# Patient Record
Sex: Male | Born: 1975 | Race: White | Hispanic: No | Marital: Single | Smoking: Never smoker
Health system: Southern US, Community
[De-identification: ages and names within clinical notes are randomized; demographics above are authoritative.]

## PROBLEM LIST (undated history)

## (undated) DIAGNOSIS — I5042 Chronic combined systolic (congestive) and diastolic (congestive) heart failure: Secondary | ICD-10-CM

## (undated) DIAGNOSIS — I10 Essential (primary) hypertension: Secondary | ICD-10-CM

## (undated) DIAGNOSIS — I447 Left bundle-branch block, unspecified: Secondary | ICD-10-CM

## (undated) DIAGNOSIS — I428 Other cardiomyopathies: Secondary | ICD-10-CM

## (undated) DIAGNOSIS — I3139 Other pericardial effusion (noninflammatory): Secondary | ICD-10-CM

## (undated) DIAGNOSIS — I313 Pericardial effusion (noninflammatory): Secondary | ICD-10-CM

## (undated) HISTORY — DX: Other pericardial effusion (noninflammatory): I31.39

## (undated) HISTORY — PX: NEUROBLASTOMA EXCISION: SHX2088

## (undated) HISTORY — DX: Chronic combined systolic (congestive) and diastolic (congestive) heart failure: I50.42

## (undated) HISTORY — DX: Pericardial effusion (noninflammatory): I31.3

## (undated) HISTORY — DX: Left bundle-branch block, unspecified: I44.7

## (undated) HISTORY — DX: Other cardiomyopathies: I42.8

---

## 2017-10-02 ENCOUNTER — Ambulatory Visit (HOSPITAL_COMMUNITY)
Admission: EM | Admit: 2017-10-02 | Discharge: 2017-10-02 | Disposition: A | Discharge status: Home / Self Care | Payer: Medicaid Other | Attending: Family Medicine | Admitting: Family Medicine

## 2017-10-02 ENCOUNTER — Other Ambulatory Visit: Payer: Self-pay

## 2017-10-02 ENCOUNTER — Encounter (HOSPITAL_COMMUNITY): Payer: Self-pay

## 2017-10-02 DIAGNOSIS — R079 Chest pain, unspecified: Secondary | ICD-10-CM | POA: Diagnosis not present

## 2017-10-02 HISTORY — DX: Essential (primary) hypertension: I10

## 2017-10-02 NOTE — ED Notes (Signed)
RR = 26.  Pt denies any SOB or any pain/indigestion at this time.

## 2017-10-02 NOTE — ED Provider Notes (Signed)
[facility]    CSN: [id number] Arrival date & time: 10/02/17  1557     History   Chief Complaint Chief Complaint  Patient presents with  . Gastroesophageal Reflux    HPI John Olsen is a 42 y.o. male.   42 yo male here for shortness of breath and midsternal chest pain x 2 weeks on and off. He thought it was indigestion but the episodes are occurring more frequently. Sometimes walking around causes the episodes. No family history of cardiac disease. He does not take blood pressure medication currently      Past Medical History:  Diagnosis Date  . Hypertension     There are no active problems to display for this patient.   History reviewed. No pertinent surgical history.     Home Medications    Prior to Admission medications   Not on File    Family History Family History  Problem Relation Age of Onset  . Cancer Mother   . Healthy Father     Social History Social History   Tobacco Use  . Smoking status: Never Smoker  . Smokeless tobacco: Never Used  Substance Use Topics  . Alcohol use: Never    Frequency: Never  . Drug use: Never     Allergies   Penicillins   Review of Systems Review of Systems  Constitutional: Negative for activity change and appetite change.  HENT: Negative for congestion and ear discharge.   Eyes: Negative for discharge and itching.  Respiratory: Negative for apnea and cough.   Cardiovascular: Positive for chest pain.  Gastrointestinal: Positive for abdominal pain.  Endocrine: Negative for cold intolerance and heat intolerance.  Genitourinary: Negative for difficulty urinating and dysuria.  Musculoskeletal: Negative for arthralgias and back pain.  Hematological: Negative for adenopathy. Does not bruise/bleed easily.     Physical Exam Triage Vital Signs ED Triage Vitals  Enc Vitals Group     BP 10/02/17 1626 (!) 172/114     Pulse Rate 10/02/17 1626 (!) 101     Resp 10/02/17 1626 18     Temp 10/02/17  1626 98 F (36.7 C)     Temp src --      SpO2 10/02/17 1626 100 %     Weight 10/02/17 1627 200 lb (90.7 kg)     Height --      Head Circumference --      Peak Flow --      Pain Score 10/02/17 1627 0     Pain Loc --      Pain Edu? --      Excl. in GC? --    No data found.  Updated Vital Signs BP (!) 172/114   Pulse (!) 101   Temp 98 F (36.7 C)   Resp 18   Wt 200 lb (90.7 kg)   SpO2 100%   Visual Acuity Right Eye Distance:   Left Eye Distance:   Bilateral Distance:    Right Eye Near:   Left Eye Near:    Bilateral Near:     Physical Exam CONSTITUTIONAL: Well-developed, well-nourished male in no acute distress.  EYES: EOM intact, conjunctivae normal, no scleral icterus HEAD: Normocephalic, atraumatic ENT: External right and left ear normal, oropharynx is clear and moist. CARDIOVASCULAR: No cyanosis or edema. 2+ distal pulses.  RESPIRATORY:Effort and breath sounds normal, no problems with respiration noted. GASTROINTESTINAL:Soft, normal bowel sounds, no distention noted.  No tenderness, rebound or guarding.  MUSCULOSKELETAL: Normal range of motion. No tenderness.  SKIN: Skin is warm and dry. No rash noted. Not diaphoretic. No erythema. No pallor. NEUROLGIC: Alert and oriented to person, place, and time. Normal reflexes, muscle tone, coordination. No cranial nerve deficit noted. PSYCHIATRIC: Normal mood and affect. Normal behavior. Normal judgment and thought content. HEM/LYMPH/IMMUNOLOGIC: Neck supple, no masses.  Normal thyroid.    UC Treatments / Results  Labs (all labs ordered are listed, but only abnormal results are displayed) Labs Reviewed - No data to display  EKG None Radiology No results found.  Procedures Procedures (including critical care time)  Medications Ordered in UC Medications - No data to display   Initial Impression / Assessment and Plan / UC Course  I have reviewed the triage vital signs and the nursing notes.  Pertinent labs &  imaging results that were available during my care of the patient were reviewed by me and considered in my medical decision making (see chart for details).     1. Chest pain- EKG shows left bundle branch block. With symptoms of SOB and midsternal chest pain, I advised patient to go to ED for further evaluation and r/o ACS. He agrees to this.   Final Clinical Impressions(s) / UC Diagnoses   Final diagnoses:  None    ED Discharge Orders    None       Controlled Substance Prescriptions  Controlled Substance Registry consulted? Not Applicable   Rolm Bookbinder, DO 10/02/17 1703

## 2017-10-02 NOTE — ED Notes (Signed)
EKG shown to Dr. Rachelle Hora.

## 2017-10-02 NOTE — ED Triage Notes (Signed)
Pt presents with multiple complaints. States over the last two weeks he has had on and off shortness of breath and weakness, trouble digesting food and indigestion.  Pt denies any symptoms at this time. Reports getting over a cold.

## 2017-10-11 ENCOUNTER — Encounter (HOSPITAL_COMMUNITY): Payer: Self-pay | Admitting: *Deleted

## 2017-10-11 ENCOUNTER — Emergency Department (HOSPITAL_COMMUNITY): Payer: Medicaid Other

## 2017-10-11 ENCOUNTER — Other Ambulatory Visit: Payer: Self-pay

## 2017-10-11 ENCOUNTER — Inpatient Hospital Stay (HOSPITAL_COMMUNITY): Payer: Medicaid Other

## 2017-10-11 ENCOUNTER — Inpatient Hospital Stay (HOSPITAL_COMMUNITY)
Admission: EM | Admit: 2017-10-11 | Discharge: 2017-10-14 | DRG: 287 | Disposition: A | Discharge status: Home / Self Care | Payer: Medicaid Other | Attending: Internal Medicine | Admitting: Internal Medicine

## 2017-10-11 DIAGNOSIS — E669 Obesity, unspecified: Secondary | ICD-10-CM | POA: Diagnosis present

## 2017-10-11 DIAGNOSIS — Z800 Family history of malignant neoplasm of digestive organs: Secondary | ICD-10-CM | POA: Diagnosis not present

## 2017-10-11 DIAGNOSIS — I509 Heart failure, unspecified: Secondary | ICD-10-CM | POA: Diagnosis not present

## 2017-10-11 DIAGNOSIS — I160 Hypertensive urgency: Secondary | ICD-10-CM | POA: Diagnosis not present

## 2017-10-11 DIAGNOSIS — Z880 Allergy status to penicillin: Secondary | ICD-10-CM | POA: Diagnosis not present

## 2017-10-11 DIAGNOSIS — Z6836 Body mass index (BMI) 36.0-36.9, adult: Secondary | ICD-10-CM | POA: Diagnosis not present

## 2017-10-11 DIAGNOSIS — E119 Type 2 diabetes mellitus without complications: Secondary | ICD-10-CM

## 2017-10-11 DIAGNOSIS — I340 Nonrheumatic mitral (valve) insufficiency: Secondary | ICD-10-CM | POA: Diagnosis not present

## 2017-10-11 DIAGNOSIS — Z79899 Other long term (current) drug therapy: Secondary | ICD-10-CM

## 2017-10-11 DIAGNOSIS — I420 Dilated cardiomyopathy: Secondary | ICD-10-CM

## 2017-10-11 DIAGNOSIS — I161 Hypertensive emergency: Secondary | ICD-10-CM | POA: Diagnosis present

## 2017-10-11 DIAGNOSIS — I10 Essential (primary) hypertension: Secondary | ICD-10-CM

## 2017-10-11 DIAGNOSIS — Z85858 Personal history of malignant neoplasm of other endocrine glands: Secondary | ICD-10-CM

## 2017-10-11 DIAGNOSIS — E876 Hypokalemia: Secondary | ICD-10-CM | POA: Diagnosis present

## 2017-10-11 DIAGNOSIS — Z8249 Family history of ischemic heart disease and other diseases of the circulatory system: Secondary | ICD-10-CM

## 2017-10-11 DIAGNOSIS — I5043 Acute on chronic combined systolic (congestive) and diastolic (congestive) heart failure: Secondary | ICD-10-CM | POA: Diagnosis present

## 2017-10-11 DIAGNOSIS — I5020 Unspecified systolic (congestive) heart failure: Secondary | ICD-10-CM

## 2017-10-11 DIAGNOSIS — I313 Pericardial effusion (noninflammatory): Secondary | ICD-10-CM | POA: Diagnosis present

## 2017-10-11 DIAGNOSIS — I5041 Acute combined systolic (congestive) and diastolic (congestive) heart failure: Secondary | ICD-10-CM | POA: Diagnosis not present

## 2017-10-11 DIAGNOSIS — I110 Hypertensive heart disease with heart failure: Principal | ICD-10-CM | POA: Diagnosis present

## 2017-10-11 DIAGNOSIS — I447 Left bundle-branch block, unspecified: Secondary | ICD-10-CM | POA: Diagnosis present

## 2017-10-11 LAB — CBC
HEMATOCRIT: 43.4 % (ref 39.0–52.0)
HEMOGLOBIN: 14.5 g/dL (ref 13.0–17.0)
MCH: 31 pg (ref 26.0–34.0)
MCHC: 33.4 g/dL (ref 30.0–36.0)
MCV: 92.9 fL (ref 78.0–100.0)
Platelets: 257 10*3/uL (ref 150–400)
RBC: 4.67 MIL/uL (ref 4.22–5.81)
RDW: 14.1 % (ref 11.5–15.5)
WBC: 8.8 10*3/uL (ref 4.0–10.5)

## 2017-10-11 LAB — HEMOGLOBIN A1C
Hgb A1c MFr Bld: 5.6 % (ref 4.8–5.6)
Mean Plasma Glucose: 114.02 mg/dL

## 2017-10-11 LAB — ECHOCARDIOGRAM COMPLETE
Height: 66 in
Weight: 3660.8 oz

## 2017-10-11 LAB — COMPREHENSIVE METABOLIC PANEL
ALK PHOS: 68 U/L (ref 38–126)
ALT: 43 U/L (ref 17–63)
AST: 31 U/L (ref 15–41)
Albumin: 3.3 g/dL — ABNORMAL LOW (ref 3.5–5.0)
Anion gap: 11 (ref 5–15)
BILIRUBIN TOTAL: 1.6 mg/dL — AB (ref 0.3–1.2)
BUN: 25 mg/dL — ABNORMAL HIGH (ref 6–20)
CALCIUM: 8.3 mg/dL — AB (ref 8.9–10.3)
CO2: 22 mmol/L (ref 22–32)
Chloride: 106 mmol/L (ref 101–111)
Creatinine, Ser: 1.17 mg/dL (ref 0.61–1.24)
GFR calc non Af Amer: >60 mL/min (ref >60)
Glucose, Bld: 114 mg/dL — ABNORMAL HIGH (ref 65–99)
Potassium: 3.7 mmol/L (ref 3.5–5.1)
SODIUM: 139 mmol/L (ref 135–145)
TOTAL PROTEIN: 5.9 g/dL — AB (ref 6.5–8.1)

## 2017-10-11 LAB — BRAIN NATRIURETIC PEPTIDE: B NATRIURETIC PEPTIDE 5: 1070 pg/mL — AB (ref 0.0–100.0)

## 2017-10-11 LAB — LIPID PANEL
CHOL/HDL RATIO: 4.2 ratio
Cholesterol: 121 mg/dL (ref 0–200)
HDL: 29 mg/dL — AB (ref >40)
LDL CALC: 78 mg/dL (ref 0–99)
TRIGLYCERIDES: 72 mg/dL (ref <150)
VLDL: 14 mg/dL (ref 0–40)

## 2017-10-11 LAB — MRSA PCR SCREENING: MRSA by PCR: NEGATIVE

## 2017-10-11 LAB — I-STAT TROPONIN, ED: TROPONIN I, POC: 0.03 ng/mL (ref 0.00–0.08)

## 2017-10-11 LAB — TROPONIN I

## 2017-10-11 LAB — TSH: TSH: 3.37 u[IU]/mL (ref 0.350–4.500)

## 2017-10-11 MED ORDER — NITROGLYCERIN 2 % TD OINT
1.0000 [in_us] | TOPICAL_OINTMENT | Freq: Once | TRANSDERMAL | Status: AC
  Administered 2017-10-11: 1 [in_us] via TOPICAL
  Filled 2017-10-11: qty 1

## 2017-10-11 MED ORDER — METOPROLOL TARTRATE 25 MG PO TABS: 25.0000 mg | ORAL_TABLET | Freq: Two times a day (BID) | ORAL | Status: DC

## 2017-10-11 MED ORDER — NITROGLYCERIN IN D5W 200-5 MCG/ML-% IV SOLN
0.0000 ug/min | INTRAVENOUS | Status: DC
  Administered 2017-10-11: 5 ug/min via INTRAVENOUS
  Filled 2017-10-11: qty 250

## 2017-10-11 MED ORDER — HYDRALAZINE HCL 20 MG/ML IJ SOLN
10.0000 mg | Freq: Four times a day (QID) | INTRAMUSCULAR | Status: DC | PRN
  Administered 2017-10-11: 10 mg via INTRAVENOUS
  Filled 2017-10-11: qty 1

## 2017-10-11 MED ORDER — FUROSEMIDE 10 MG/ML IJ SOLN
40.0000 mg | Freq: Two times a day (BID) | INTRAMUSCULAR | Status: DC
  Administered 2017-10-11 – 2017-10-13 (×4): 40 mg via INTRAVENOUS
  Filled 2017-10-11 (×5): qty 4

## 2017-10-11 MED ORDER — FUROSEMIDE 10 MG/ML IJ SOLN
60.0000 mg | Freq: Once | INTRAMUSCULAR | Status: AC
Start: 2017-10-11 — End: 2017-10-11
  Administered 2017-10-11: 60 mg via INTRAVENOUS
  Filled 2017-10-11: qty 8

## 2017-10-11 MED ORDER — SODIUM CHLORIDE 0.9 % IJ SOLN
INTRAMUSCULAR | Status: AC
  Filled 2017-10-11: qty 50

## 2017-10-11 MED ORDER — IOPAMIDOL (ISOVUE-370) INJECTION 76%
100.0000 mL | Freq: Once | INTRAVENOUS | Status: AC | PRN
  Administered 2017-10-11: 100 mL via INTRAVENOUS

## 2017-10-11 MED ORDER — METOPROLOL TARTRATE 25 MG PO TABS
12.5000 mg | ORAL_TABLET | Freq: Two times a day (BID) | ORAL | Status: DC
  Administered 2017-10-11: 12.5 mg via ORAL
  Filled 2017-10-11: qty 1

## 2017-10-11 MED ORDER — METOPROLOL TARTRATE 25 MG PO TABS
50.0000 mg | ORAL_TABLET | Freq: Two times a day (BID) | ORAL | Status: DC
  Administered 2017-10-11 – 2017-10-12 (×2): 50 mg via ORAL
  Filled 2017-10-11 (×2): qty 2

## 2017-10-11 MED ORDER — AMLODIPINE BESYLATE 10 MG PO TABS
10.0000 mg | ORAL_TABLET | Freq: Every day | ORAL | Status: DC
  Administered 2017-10-11: 10 mg via ORAL
  Filled 2017-10-11: qty 1

## 2017-10-11 MED ORDER — IOPAMIDOL (ISOVUE-370) INJECTION 76%
INTRAVENOUS | Status: AC
  Filled 2017-10-11: qty 100

## 2017-10-11 NOTE — H&P (Signed)
History and Physical  Abeer Amorin DGU:[id number] DOB: 07-18-1975 DOA: 10/11/2017  Referring physician: Dr Freida Busman PCP: Patient, No Pcp Per  Outpatient Specialists: None Patient coming from: Home  Chief Complaint: Worsening shortness of breath  HPI: John Olsen is a 42 y.o. male with medical history significant for hypertension, obesity who presented to the [facility] with complaints of gradually worsening shortness of breath with onset 3-4 weeks ago.  Symptoms are worsened by exertion and associated with dyspnea, orthopnea, bilateral lower extremity edema, and intermittent nonproductive cough.  Denies any chest pain or palpitations. Not taking any medications and not following up with PCP.  Denies prior history of heart disease.  ED Course: On presentation to the ED, patient was noted to have hypertensive emergency with uncontrolled blood pressure, elevated creatinine, and cardiomegaly on chest x-ray.  Personally reviewed chest x-ray. It revealed increase in pulmonary vascularity and cardiomegaly, boot shaped heart.  BNP greater than 1000, troponins negative x1, no ischemic changes on EKG.  Review of Systems: Review of systems as noted in the HPI.  All other systems reviewed and are negative.   Past Medical History:  Diagnosis Date  . Hypertension    History reviewed. No pertinent surgical history.  Social History:  reports that he has never smoked. He has never used smokeless tobacco. He reports that he does not drink alcohol or use drugs.   Allergies  Allergen Reactions  . Penicillins     Family History  Problem Relation Age of Onset  . Cancer Mother   . Healthy Father     Father with heart disease in his 52s. Mother deceased with liver and pancreatic cancer. Patient has no siblings.  He is only child.  Prior to Admission medications   Not on File    Physical Exam: BP (!) 157/113   Pulse 98   Temp 97.6 F (36.4 C) (Oral)   Resp 20   Ht 5\' 6"  (1.676 m)   Wt 103.8 kg  (228 lb 12.8 oz)   SpO2 98%   BMI 36.93 kg/m   General: 42 year old Caucasian male obese sitting up in the bed in no acute distress.  Alert and oriented x3. Eyes: Anicteric sclera. ENT: Mucous membrane moist with no erythema or exudates. Neck: No JVD or thyromegaly. Cardiovascular: Regular rate and rhythm with no rubs or gallops. Respiratory: Mild crackles at bases with no wheezes. Abdomen: Soft nontender nondistended normal bowel sounds x4 quadrant. Skin: No rashes or ulcerative lesions. Musculoskeletal: Moves all 4 extremities.  2+ edema in lower extremities bilaterally. Psychiatric: Mood is appropriate for condition and setting Neurologic: Alert and oriented x3.  No focal motor deficits.          Labs on Admission:  Basic Metabolic Panel: Recent Labs  Lab 10/11/17 0226  NA 139  K 3.7  CL 106  CO2 22  GLUCOSE 114*  BUN 25*  CREATININE 1.17  CALCIUM 8.3*   Liver Function Tests: Recent Labs  Lab 10/11/17 0226  AST 31  ALT 43  ALKPHOS 68  BILITOT 1.6*  PROT 5.9*  ALBUMIN 3.3*   No results for input(s): LIPASE, AMYLASE in the last 168 hours. No results for input(s): AMMONIA in the last 168 hours. CBC: Recent Labs  Lab 10/11/17 0226  WBC 8.8  HGB 14.5  HCT 43.4  MCV 92.9  PLT 257   Cardiac Enzymes: Recent Labs  Lab 10/11/17 0226  TROPONINI <0.03    BNP (last 3 results) Recent Labs    10/11/17 0226  BNP 1,070.0*    ProBNP (last 3 results) No results for input(s): PROBNP in the last 8760 hours.  CBG: No results for input(s): GLUCAP in the last 168 hours.  Radiological Exams on Admission: Dg Chest 2 View  Result Date: 10/11/2017 CLINICAL DATA:  Acute onset of shortness of breath and difficulty sleeping. EXAM: CHEST - 2 VIEW COMPARISON:  None. FINDINGS: The lungs are well-aerated. Vascular congestion is noted. Increased interstitial markings raise concern for mild interstitial edema. There is no evidence of pleural effusion or pneumothorax. The  heart is enlarged.  No acute osseous abnormalities are seen. IMPRESSION: Vascular congestion and cardiomegaly. Increased interstitial markings raise concern for mild interstitial edema. Electronically Signed   By: Roanna Raider M.D.   On: 10/11/2017 02:58    EKG: Independently reviewed.  Personally reviewed EKG revealed sinus tachycardia with left bundle branch block.  Assessment/Plan Present on Admission: **None**  Active Problems:   New onset of congestive heart failure (HCC)  New onset of congestive heart failure, unspecified due to incomplete workup Chest x-ray personally reviewed revealed cardiomegaly, balloon shaped heart, with increase in pulmonary vascularity Ordered CT chest to rule out pericardial effusion 2D echo ordered Cardiology consult  Hypertensive emergency Prior hx of HTN Elevated creatinine, cardiomegaly Not on medications prior to presentation Start beta-blocker Cardiology consult Continue to monitor vital signs  Dyspnea, most likely multifactorial secondary to heart failure versus others Rule out PE CTA PE ordered.  Awaiting results. O2 supplementation to maintain O2 saturation greater than 92% Continue close monitoring  Sinus tachycardia Start beta blocker Obtain CTA PE  Elevated creatinine No previous records for baseline Creatinine 1.17 Control hypertension Avoid nephrotoxic agent/hypotension/dehydration Repeat BMP in the morning  Obesity BMI 36 Recommend weight loss outpatient     DVT prophylaxis: Lovenox subcu daily  Code Status: Full code  Family Communication: None at bedside  Disposition Plan: Admit to telemetry  Consults called: Cardiology  Admission status: Inpatient    Darlin Drop MD [facility] Pager [phone]  If 7PM-7AM, please contact night-coverage [email or web address] Password [email or web address]  10/11/2017, 11:00 AM

## 2017-10-11 NOTE — ED Notes (Signed)
LS clear throughout with auscultation.

## 2017-10-11 NOTE — ED Notes (Signed)
ED TO INPATIENT HANDOFF REPORT  Name/Age/Gender John Olsen 42 y.o. male  Code Status    Code Status Orders  (From admission, onward)        Start     Ordered   10/11/17 1022  Full code  Continuous     10/11/17 1021    Code Status History    This patient has a current code status but no historical code status.      Home/SNF/Other Home  Chief Complaint CHEST PAIN; SOB  Level of Care/Admitting Diagnosis ED Disposition    ED Disposition Condition Comment   Admit  Hospital Area: [place] [[id number]]  Level of Care: Telemetry [5]  Admit to tele based on following criteria: Acute CHF  Diagnosis: New onset of congestive heart failure [facility]) [[id number]]  Admitting Physician: Kayleen Memos [[id number]]  Attending Physician: Kayleen Memos [[id number]]  Estimated length of stay: past midnight tomorrow  Certification:: I certify this patient will need inpatient services for at least 2 midnights  PT Class (Do Not Modify): Inpatient [101]  PT Acc Code (Do Not Modify): Private [1]       Medical History Past Medical History:  Diagnosis Date  . Hypertension     Allergies Allergies  Allergen Reactions  . Penicillins     IV Location/Drains/Wounds Patient Lines/Drains/Airways Status   Active Line/Drains/Airways    Name:   Placement date:   Placement time:   Site:   Days:   Peripheral IV 10/11/17 Right Antecubital   10/11/17    0754    Antecubital   less than 1          Labs/Imaging Results for orders placed or performed during the hospital encounter of 10/11/17 (from the past 48 hour(s))  CBC     Status: None   Collection Time: 10/11/17  2:26 AM  Result Value Ref Range   WBC 8.8 4.0 - 10.5 K/uL   RBC 4.67 4.22 - 5.81 MIL/uL   Hemoglobin 14.5 13.0 - 17.0 g/dL   HCT 43.4 39.0 - 52.0 %   MCV 92.9 78.0 - 100.0 fL   MCH 31.0 26.0 - 34.0 pg   MCHC 33.4 30.0 - 36.0 g/dL   RDW 14.1 11.5 - 15.5 %   Platelets 257 150 - 400 K/uL    Comment: Performed  at [facility], [place] [address], [place], [place] [address]  Comprehensive metabolic panel     Status: Abnormal   Collection Time: 10/11/17  2:26 AM  Result Value Ref Range   Sodium 139 135 - 145 mmol/L   Potassium 3.7 3.5 - 5.1 mmol/L   Chloride 106 101 - 111 mmol/L   CO2 22 22 - 32 mmol/L   Glucose, Bld 114 (H) 65 - 99 mg/dL   BUN 25 (H) 6 - 20 mg/dL   Creatinine, Ser 1.17 0.61 - 1.24 mg/dL   Calcium 8.3 (L) 8.9 - 10.3 mg/dL   Total Protein 5.9 (L) 6.5 - 8.1 g/dL   Albumin 3.3 (L) 3.5 - 5.0 g/dL   AST 31 15 - 41 U/L   ALT 43 17 - 63 U/L   Alkaline Phosphatase 68 38 - 126 U/L   Total Bilirubin 1.6 (H) 0.3 - 1.2 mg/dL   GFR calc non Af Amer >60 >60 mL/min   GFR calc Af Amer >60 >60 mL/min    Comment: (NOTE) The eGFR has been calculated using the CKD EPI equation. This calculation has not been validated in  all clinical situations. eGFR's persistently <60 mL/min signify possible Chronic Kidney Disease.    Anion gap 11 5 - 15    Comment: Performed at [facility], [place] [address] [place], [place] [address]  Troponin I     Status: None   Collection Time: 10/11/17  2:26 AM  Result Value Ref Range   Troponin I <0.03 <0.03 ng/mL    Comment: Performed at [facility], [place] [address], [place], [place] [address]  Brain natriuretic peptide     Status: Abnormal   Collection Time: 10/11/17  2:26 AM  Result Value Ref Range   B Natriuretic Peptide 1,070.0 (H) 0.0 - 100.0 pg/mL    Comment: Performed at [facility], [place] [address] [place], [place] [address]  Lipid panel     Status: Abnormal   Collection Time: 10/11/17  2:26 AM  Result Value Ref Range   Cholesterol 121 0 - 200 mg/dL   Triglycerides 72 <150 mg/dL   HDL 29 (L) >40 mg/dL   Total CHOL/HDL Ratio 4.2 RATIO   VLDL 14 0 - 40 mg/dL   LDL Cholesterol 78 0 - 99 mg/dL    Comment:        Total Cholesterol/HDL:CHD Risk Coronary Heart Disease Risk Table                      Men   Women  1/2 Average Risk   3.4   3.3  Average Risk       5.0   4.4  2 X Average Risk   9.6   7.1  3 X Average Risk  23.4   11.0        Use the calculated Patient Ratio above and the CHD Risk Table to determine the patient's CHD Risk.        ATP III CLASSIFICATION (LDL):  <100     mg/dL   Optimal  100-129  mg/dL   Near or Above                    Optimal  130-159  mg/dL   Borderline  160-189  mg/dL   High  >190     mg/dL   Very High Performed at [place] [address] [place], [place] [address]   I-stat troponin, ED     Status: None   Collection Time: 10/11/17  7:59 AM  Result Value Ref Range   Troponin i, poc 0.03 0.00 - 0.08 ng/mL   Comment 3            Comment: Due to the release kinetics of cTnI, a negative result within the first hours of the onset of symptoms does not rule out myocardial infarction with certainty. If myocardial infarction is still suspected, repeat the test at appropriate intervals.   TSH     Status: None   Collection Time: 10/11/17 11:39 AM  Result Value Ref Range   TSH 3.370 0.350 - 4.500 uIU/mL    Comment: Performed by a 3rd Generation assay with a functional sensitivity of <=0.01 uIU/mL. Performed at [facility], [place] [address] [place], [place] [address]   Hemoglobin A1c     Status: None   Collection Time: 10/11/17 11:39 AM  Result Value Ref Range   Hgb A1c MFr Bld 5.6 4.8 - 5.6 %    Comment: (NOTE) Pre diabetes:          5.7%-6.4%  Diabetes:              >6.4% Glycemic control for   <7.0% adults with diabetes    Mean Plasma Glucose 114.02 mg/dL    Comment: Performed at [place] [address], [place], [place] [address]   Dg Chest 2 View  Result Date: 10/11/2017 CLINICAL DATA:  Acute onset of shortness of breath and difficulty sleeping. EXAM: CHEST - 2 VIEW COMPARISON:  None. FINDINGS: The lungs are well-aerated. Vascular congestion is noted. Increased interstitial  markings raise concern for mild interstitial edema. There is no evidence of pleural effusion or pneumothorax. The heart is enlarged.  No acute osseous abnormalities are seen. IMPRESSION: Vascular congestion and cardiomegaly. Increased interstitial markings raise concern for mild interstitial edema. Electronically Signed   By: Garald Balding M.D.   On: 10/11/2017 02:58   Ct Angio Chest Pe W Or Wo Contrast  Result Date: 10/11/2017 CLINICAL DATA:  Shortness of breath EXAM: CT ANGIOGRAPHY CHEST WITH CONTRAST TECHNIQUE: Multidetector CT imaging of the chest was performed using the standard protocol during bolus administration of intravenous contrast. Multiplanar CT image reconstructions and MIPs were obtained to evaluate the vascular anatomy. CONTRAST:  152m ISOVUE-370 IOPAMIDOL (ISOVUE-370) INJECTION 76% COMPARISON:  Chest radiograph October 11, 2017 FINDINGS: Cardiovascular: There is no demonstrable pulmonary embolus. There is no thoracic aortic aneurysm. No dissection is seen. It must be noted that the contrast bolus in the aorta is not sufficient to entirely exclude dissection as a potential radiographic differential consideration. The great vessels appear unremarkable. There is cardiomegaly with predominantly left-sided cardiac enlargement. There is a fairly small pericardial effusion present. The main pulmonary outflow tract measures 3.3 cm in diameter, abnormally prominent. Mediastinum/Nodes: Visualized thyroid appears normal. There are multiple mildly prominent lymph nodes throughout the mediastinum. In the aortopulmonary window region, largest lymph node measures 1.8 x 1.2 cm. In the right paratracheal region, largest lymph node measures 1.6 x 1.3 cm. In the right hilar region, largest lymph node measures 1.4 x 1.4 cm. In the subcarinal region on the right, there is a lymph node measuring 1.6 x 1.4 cm. No esophageal lesions are evident. Lungs/Pleura: There is interstitial edema, most notably in the lower  lobes. There is patchy atelectatic change in the right upper lobe. A small amount of effusion is tracking along the major fissure on the right. No airspace consolidation is evident. Upper Abdomen: In the visualized upper abdomen, there is contrast reflux into the inferior vena cava and hepatic veins. Visualized upper abdominal structures otherwise appear unremarkable. Musculoskeletal: There are no blastic or lytic bone lesions. No evident chest wall lesion. Review of the MIP images confirms the above findings. IMPRESSION: 1.  No demonstrable pulmonary embolus. 2. Cardiomegaly with primarily left-sided cardiac enlargement. There is interstitial edema with a small right pleural effusion. These findings are felt to be indicative of a degree of underlying congestive heart failure. 3.  Small pericardial effusion. 4. Increase in main pulmonary outflow tract size, indicative of a degree of underlying pulmonary arterial hypertension. 5. Reflux of contrast into the inferior vena cava and hepatic veins, a finding likely indicative of increase in right heart pressure. 6.  No airspace consolidation. 7. Several enlarged lymph nodes of uncertain etiology. These lymph nodes potentially may have reactive etiology. 8. No appreciable thoracic aortic aneurysm. No dissection seen. Note that the contrast bolus in the aorta is not sufficient for exclusion of dissection from a radiographic standpoint. Electronically Signed   By: WLowella Grip  III M.D.   On: 10/11/2017 11:04    Pending Labs Unresulted Labs (From admission, onward)   Start     Ordered   10/12/17 0500  CBC  Tomorrow morning,   R     10/11/17 1124   10/12/17 9090  Basic metabolic panel  Daily,   R     10/11/17 1325   10/11/17 1343  5 HIAA, quantitative, urine, 24 hour  Once,   R     10/11/17 1342   10/11/17 1343  Cortisol, urine, free  Once,   R     10/11/17 1342   10/11/17 1343  Aldosterone + renin activity w/ ratio  Once,   R     10/11/17 1342   10/11/17  1342  Metanephrines, urine, 24 hour  Once,   R     10/11/17 1342   10/11/17 1342  Catecholamines,Ur.,Free,24 Hh  Once,   R     10/11/17 1342      Vitals/Pain Today's Vitals   10/11/17 1500 10/11/17 1502 10/11/17 1600 10/11/17 1616  BP: (!) 149/125 (!) 149/125 (!) 175/148 (!) 153/101  Pulse: 96 89 (!) 105 95  Resp: (!) 23 (!) 22 (!) 22 20  Temp:      TempSrc:      SpO2: 97% 99% 93% 96%  Weight:      Height:        Isolation Precautions No active isolations  Medications Medications  sodium chloride 0.9 % injection (has no administration in time range)  iopamidol (ISOVUE-370) 76 % injection (has no administration in time range)  hydrALAZINE (APRESOLINE) injection 10 mg (10 mg Intravenous Given 10/11/17 1210)  furosemide (LASIX) injection 40 mg (has no administration in time range)  metoprolol tartrate (LOPRESSOR) tablet 25 mg (has no administration in time range)  nitroGLYCERIN 50 mg in dextrose 5 % 250 mL (0.2 mg/mL) infusion (10 mcg/min Intravenous Rate/Dose Change 10/11/17 1613)  furosemide (LASIX) injection 60 mg (60 mg Intravenous Given 10/11/17 0959)  nitroGLYCERIN (NITROGLYN) 2 % ointment 1 inch (1 inch Topical Given 10/11/17 0958)  iopamidol (ISOVUE-370) 76 % injection 100 mL (100 mLs Intravenous Contrast Given 10/11/17 1038)    Mobility walks

## 2017-10-11 NOTE — Progress Notes (Signed)
  Echocardiogram 2D Echocardiogram has been performed.  Mechele Kittleson T Mandolin Falwell 10/11/2017, 2:18 PM

## 2017-10-11 NOTE — ED Provider Notes (Signed)
[facility]-EMERGENCY DEPT Provider Note   CSN: [id number] Arrival date & time: 10/11/17  0149     History   Chief Complaint Chief Complaint  Patient presents with  . Shortness of Breath    HPI John Olsen is a 42 y.o. male.  42 year old male presents with one-month history of increasing dyspnea on exertion as well as orthopnea.  Is also noted worsening lower extremity edema.  Denies any prior history of CHF but does have a history of hypertension for which he has been treating sporadically.  Over the last few days, he has noted some increased nonproductive cough without fever or chills.  Denies any anginal type chest pain.  No fever or chills.  Came today because of worsening dyspnea.  Denies any recent history of blood loss     Past Medical History:  Diagnosis Date  . Hypertension     There are no active problems to display for this patient.   History reviewed. No pertinent surgical history.      Home Medications    Prior to Admission medications   Not on File    Family History Family History  Problem Relation Age of Onset  . Cancer Mother   . Healthy Father     Social History Social History   Tobacco Use  . Smoking status: Never Smoker  . Smokeless tobacco: Never Used  Substance Use Topics  . Alcohol use: Never    Frequency: Never  . Drug use: Never     Allergies   Penicillins   Review of Systems Review of Systems  All other systems reviewed and are negative.    Physical Exam Updated Vital Signs BP (!) 172/123   Pulse (!) 103   Temp 97.6 F (36.4 C) (Oral)   Resp 20   Ht 1.676 m (5\' 6" )   Wt 103.8 kg (228 lb 12.8 oz)   SpO2 93%   BMI 36.93 kg/m   Physical Exam  Constitutional: He is oriented to person, place, and time. He appears well-developed and well-nourished.  Non-toxic appearance. No distress.  HENT:  Head: Normocephalic and atraumatic.  Eyes: Pupils are equal, round, and reactive to light.  Conjunctivae, EOM and lids are normal.  Neck: Normal range of motion. Neck supple. No tracheal deviation present. No thyroid mass present.  Cardiovascular: Normal rate, regular rhythm and normal heart sounds. Exam reveals no gallop.  No murmur heard. Pulmonary/Chest: Effort normal and breath sounds normal. No stridor. No respiratory distress. He has no decreased breath sounds. He has no wheezes. He has no rhonchi. He has no rales.  Abdominal: Soft. Normal appearance and bowel sounds are normal. He exhibits no distension. There is no tenderness. There is no rebound and no CVA tenderness.  Musculoskeletal: Normal range of motion. He exhibits no edema or tenderness.  Lymphadenopathy:  3+ bilateral lower extremity pitting edema  Neurological: He is alert and oriented to person, place, and time. He has normal strength. No cranial nerve deficit or sensory deficit. GCS eye subscore is 4. GCS verbal subscore is 5. GCS motor subscore is 6.  Skin: Skin is warm and dry. No abrasion and no rash noted.  Psychiatric: He has a normal mood and affect. His speech is normal and behavior is normal.  Nursing note and vitals reviewed.    ED Treatments / Results  Labs (all labs ordered are listed, but only abnormal results are displayed) Labs Reviewed  COMPREHENSIVE METABOLIC PANEL - Abnormal; Notable for the following components:  Result Value   Glucose, Bld 114 (*)    BUN 25 (*)    Calcium 8.3 (*)    Total Protein 5.9 (*)    Albumin 3.3 (*)    Total Bilirubin 1.6 (*)    All other components within normal limits  CBC  TROPONIN I  BRAIN NATRIURETIC PEPTIDE    EKG EKG Interpretation  Date/Time:  Monday October 11 2017 02:05:33 EDT Ventricular Rate:  104 PR Interval:    QRS Duration: 192 QT Interval:  504 QTC Calculation: 664 R Axis:   -63 Text Interpretation:  Sinus tachycardia Left bundle branch block No significant change was found Confirmed by Azalia Bilis ([id number]) on 10/11/2017 2:08:11  AM   Radiology Dg Chest 2 View  Result Date: 10/11/2017 CLINICAL DATA:  Acute onset of shortness of breath and difficulty sleeping. EXAM: CHEST - 2 VIEW COMPARISON:  None. FINDINGS: The lungs are well-aerated. Vascular congestion is noted. Increased interstitial markings raise concern for mild interstitial edema. There is no evidence of pleural effusion or pneumothorax. The heart is enlarged.  No acute osseous abnormalities are seen. IMPRESSION: Vascular congestion and cardiomegaly. Increased interstitial markings raise concern for mild interstitial edema. Electronically Signed   By: Roanna Raider M.D.   On: 10/11/2017 02:58    Procedures Procedures (including critical care time)  Medications Ordered in ED Medications - No data to display   Initial Impression / Assessment and Plan / ED Course  I have reviewed the triage vital signs and the nursing notes.  Pertinent labs & imaging results that were available during my care of the patient were reviewed by me and considered in my medical decision making (see chart for details).     Patient has evidence of CHF here.  Has pedal edema as well as BNP that is over thousand.  Was given Lasix as well as Nitropaste and will be admitted to the hospitalist service  Final Clinical Impressions(s) / ED Diagnoses   Final diagnoses:  None    ED Discharge Orders    None       Lorre Nick, MD 10/11/17 [phone]

## 2017-10-11 NOTE — Consult Note (Addendum)
Cardiology Consultation:   Patient ID: John Olsen; [id number]; September 27, 1975   Admit date: 10/11/2017 Date of Consult: 10/11/2017  Primary Care Provider: Patient, No Pcp Per Primary Cardiologist: New, Dr Mayford Knife Primary Electrophysiologist:  N/a   Patient Profile:   John Olsen is a 42 y.o. male with a hx of HTN, obesity, who is being seen today for the evaluation of CHF at the request of Dr Margo Aye.  History of Present Illness:   John Olsen has never been evaluated by cardiology.  He was in [facility] 04/13 for chest pain, SBP 172/114. ECG w/ LBBB and sx concerning, pt advised to go to the ER. Not on BP rx consistently.  Pt came to [facility] ER 04/22 for DOE and orthopnea, worsening LE edema. BP 173/133.   Patient describes increasing dyspnea on exertion, increasing lower extremity edema for several weeks.  He also notes increasing orthopnea and has had several episodes of PND.  Since coming into the emergency room, he has had Lasix 60 mg, metoprolol 12.5 mg and an inch of nitroglycerin paste.  These medicines have made him feel a great deal better and is breathing better now than he has in a week or more.  He has not had any chest pain.  He has not had any dramatic change in his eating habits although admits he does not stick to a low-sodium diet.  When he moved back to [facility], he lost his PCP and has not been able to get his blood pressure medicines.  He has never had any cardiac evaluation.   Past Medical History:  Diagnosis Date  . Hypertension     Past Surgical History:  Procedure Laterality Date  . NEUROBLASTOMA EXCISION     Had prior to 42 year of age, abdominal     Prior to Admission medications   Not on File    Inpatient Medications: Scheduled Meds: . iopamidol      . metoprolol tartrate  12.5 mg Oral BID  . sodium chloride       Continuous Infusions:  PRN Meds: hydrALAZINE  Allergies:    Allergies  Allergen Reactions  . Penicillins     Social History:     Social History   Socioeconomic History  . Marital status: Single    Spouse name: Not on file  . Number of children: Not on file  . Years of education: Not on file  . Highest education level: Not on file  Occupational History  . Occupation: Unemployed  Social Needs  . Financial resource strain: Not on file  . Food insecurity:    Worry: Not on file    Inability: Not on file  . Transportation needs:    Medical: Not on file    Non-medical: Not on file  Tobacco Use  . Smoking status: Never Smoker  . Smokeless tobacco: Never Used  Substance and Sexual Activity  . Alcohol use: Never    Frequency: Never  . Drug use: Never  . Sexual activity: Not on file  Lifestyle  . Physical activity:    Days per week: Not on file    Minutes per session: Not on file  . Stress: Not on file  Relationships  . Social connections:    Talks on phone: Not on file    Gets together: Not on file    Attends religious service: Not on file    Active member of club or organization: Not on file    Attends meetings of clubs or organizations: Not on  file    Relationship status: Not on file  . Intimate partner violence:    Fear of current or ex partner: Not on file    Emotionally abused: Not on file    Physically abused: Not on file    Forced sexual activity: Not on file  Other Topics Concern  . Not on file  Social History Narrative   Moved back to [place] to live with his parents    Family History:   Family History  Problem Relation Age of Onset  . Cancer Mother   . Coronary artery disease Father        had a stent approx 2015   Family Status:  Family Status  Relation Name Status  . Mother  Deceased  . Father  Alive    ROS:  Please see the history of present illness.  All other ROS reviewed and negative.     Physical Exam/Data:   Vitals:   10/11/17 1000 10/11/17 1001 10/11/17 1100 10/11/17 1200  BP: (!) 157/113 (!) 157/113 (!) 176/127 (!) 192/157  Pulse: 90 98 (!) 104 98  Resp:  (!) 41 20 (!) 30 (!) 28  Temp:      TempSrc:      SpO2: 97% 98% 96% 95%  Weight:      Height:       No intake or output data in the 24 hours ending 10/11/17 1307 Filed Weights   10/11/17 0209  Weight: 228 lb 12.8 oz (103.8 kg)   Body mass index is 36.93 kg/m.  General:  Well nourished, well developed, in no acute distress HEENT: normal Lymph: no adenopathy Neck: no JVD seen but difficult to assess secondary to body habitus Endocrine:  No thryomegaly Vascular: No carotid bruits; 4/4 extremity pulses 2+, without bruits  Cardiac:  normal S1, S2; RRR; no murmur  Lungs: Rales bases bilaterally, no wheezing, rhonchi  Abd: soft, nontender, no hepatomegaly  Ext: 2+ edema Musculoskeletal:  No deformities, BUE and BLE strength normal and equal Skin: warm and dry; scaly erythema on both hands (pt states from washing them so much) Neuro:  CNs 2-12 intact, no focal abnormalities noted Psych:  Normal affect   EKG:  The EKG was personally reviewed and demonstrates: 4/22 ECG sinus tachycardia, heart rate 109, left bundle branch block was seen on 4/13 ECG but chronicity is unknown Telemetry:  Telemetry was personally reviewed and demonstrates: Sinus rhythm, sinus tach  Relevant CV Studies:  ECHO: Ordered  Laboratory Data:  Chemistry Recent Labs  Lab 10/11/17 0226  NA 139  K 3.7  CL 106  CO2 22  GLUCOSE 114*  BUN 25*  CREATININE 1.17  CALCIUM 8.3*  GFRNONAA >60  GFRAA >60  ANIONGAP 11    Lab Results  Component Value Date   ALT 43 10/11/2017   AST 31 10/11/2017   ALKPHOS 68 10/11/2017   BILITOT 1.6 (H) 10/11/2017   Hematology Recent Labs  Lab 10/11/17 0226  WBC 8.8  RBC 4.67  HGB 14.5  HCT 43.4  MCV 92.9  MCH 31.0  MCHC 33.4  RDW 14.1  PLT 257   Cardiac Enzymes Recent Labs  Lab 10/11/17 0226  TROPONINI <0.03    Recent Labs  Lab 10/11/17 0759  TROPIPOC 0.03    BNP Recent Labs  Lab 10/11/17 0226  BNP 1,070.0*    TSH:  Lab Results  Component  Value Date   TSH 3.370 10/11/2017   Lipids: Lab Results  Component Value Date  CHOL 121 10/11/2017   HDL 29 (L) 10/11/2017   LDLCALC 78 10/11/2017   TRIG 72 10/11/2017   CHOLHDL 4.2 10/11/2017   HgbA1c: Lab Results  Component Value Date   HGBA1C 5.6 10/11/2017   Magnesium: No results found for: MG   Radiology/Studies:  Dg Chest 2 View  Result Date: 10/11/2017 CLINICAL DATA:  Acute onset of shortness of breath and difficulty sleeping. EXAM: CHEST - 2 VIEW COMPARISON:  None. FINDINGS: The lungs are well-aerated. Vascular congestion is noted. Increased interstitial markings raise concern for mild interstitial edema. There is no evidence of pleural effusion or pneumothorax. The heart is enlarged.  No acute osseous abnormalities are seen. IMPRESSION: Vascular congestion and cardiomegaly. Increased interstitial markings raise concern for mild interstitial edema. Electronically Signed   By: Roanna Raider M.D.   On: 10/11/2017 02:58   Ct Angio Chest Pe W Or Wo Contrast  Result Date: 10/11/2017 CLINICAL DATA:  Shortness of breath EXAM: CT ANGIOGRAPHY CHEST WITH CONTRAST TECHNIQUE: Multidetector CT imaging of the chest was performed using the standard protocol during bolus administration of intravenous contrast. Multiplanar CT image reconstructions and MIPs were obtained to evaluate the vascular anatomy. CONTRAST:  ISOVUE-370 IOPAMIDOL (ISOVUE-370) INJECTION 76% COMPARISON:  Chest radiograph October 11, 2017 FINDINGS: Cardiovascular: There is no demonstrable pulmonary embolus. There is no thoracic aortic aneurysm. No dissection is seen. It must be noted that the contrast bolus in the aorta is not sufficient to entirely exclude dissection as a potential radiographic differential consideration. The great vessels appear unremarkable. There is cardiomegaly with predominantly left-sided cardiac enlargement. There is a fairly small pericardial effusion present. The main pulmonary outflow tract  measures 3.3 cm in diameter, abnormally prominent. Mediastinum/Nodes: Visualized thyroid appears normal. There are multiple mildly prominent lymph nodes throughout the mediastinum. In the aortopulmonary window region, largest lymph node measures 1.8 x 1.2 cm. In the right paratracheal region, largest lymph node measures 1.6 x 1.3 cm. In the right hilar region, largest lymph node measures 1.4 x 1.4 cm. In the subcarinal region on the right, there is a lymph node measuring 1.6 x 1.4 cm. No esophageal lesions are evident. Lungs/Pleura: There is interstitial edema, most notably in the lower lobes. There is patchy atelectatic change in the right upper lobe. A small amount of effusion is tracking along the major fissure on the right. No airspace consolidation is evident. Upper Abdomen: In the visualized upper abdomen, there is contrast reflux into the inferior vena cava and hepatic veins. Visualized upper abdominal structures otherwise appear unremarkable. Musculoskeletal: There are no blastic or lytic bone lesions. No evident chest wall lesion. Review of the MIP images confirms the above findings. IMPRESSION: 1.  No demonstrable pulmonary embolus. 2. Cardiomegaly with primarily left-sided cardiac enlargement. There is interstitial edema with a small right pleural effusion. These findings are felt to be indicative of a degree of underlying congestive heart failure. 3.  Small pericardial effusion. 4. Increase in main pulmonary outflow tract size, indicative of a degree of underlying pulmonary arterial hypertension. 5. Reflux of contrast into the inferior vena cava and hepatic veins, a finding likely indicative of increase in right heart pressure. 6.  No airspace consolidation. 7. Several enlarged lymph nodes of uncertain etiology. These lymph nodes potentially may have reactive etiology. 8. No appreciable thoracic aortic aneurysm. No dissection seen. Note that the contrast bolus in the aorta is not sufficient for exclusion  of dissection from a radiographic standpoint. Electronically Signed   By: Bretta Bang III  M.D.   On: 10/11/2017 11:04    Assessment and Plan:   Active Problems: 1.  New onset of congestive heart failure (HCC) -Agree with IV Lasix, need to continue diuresis, will use 60 mg IV twice daily. -Follow daily weights, daily BMET -On CT, there was a small pericardial effusion, cardiomegaly with left-sided cardiac enlargement but reflux of contrast into the inferior vena cava, likely representing right heart failure as well -The echo lab was contacted and they will make sure his echo was done as soon as possible and call results. -The CT results are concerning for biventricular failure - if EF is decreased, will likely need ischemic eval.  2.  Hypertensive urgency -For his heart failure, preferable rx would start hydralazine, nitrates and a beta-blocker. - will leave to IM.   For questions or updates, please contact [facility] Please consult [email or web address] for contact info under Cardiology/STEMI.   SignedTheodore Demark, PA-C  10/11/2017 1:07 PM

## 2017-10-11 NOTE — ED Notes (Signed)
Bed: WA11 Expected date:  Expected time:  Means of arrival:  Comments: 

## 2017-10-11 NOTE — ED Triage Notes (Signed)
Pt c/o continued SOB since being seen @ [facility], having difficulty sleeping, have an appointment with PCP 10/22/17.  Pt in no apparent distress.

## 2017-10-11 NOTE — ED Notes (Signed)
Nitroglycerin transfusing at 5 mcg/min

## 2017-10-12 ENCOUNTER — Inpatient Hospital Stay (HOSPITAL_COMMUNITY): Payer: Medicaid Other

## 2017-10-12 ENCOUNTER — Encounter (HOSPITAL_COMMUNITY): Payer: Self-pay | Admitting: Radiology

## 2017-10-12 DIAGNOSIS — I5041 Acute combined systolic (congestive) and diastolic (congestive) heart failure: Secondary | ICD-10-CM

## 2017-10-12 DIAGNOSIS — E876 Hypokalemia: Secondary | ICD-10-CM

## 2017-10-12 DIAGNOSIS — I420 Dilated cardiomyopathy: Secondary | ICD-10-CM

## 2017-10-12 LAB — RAPID URINE DRUG SCREEN, HOSP PERFORMED
Amphetamines: NOT DETECTED
Barbiturates: NOT DETECTED
Benzodiazepines: NOT DETECTED
Cocaine: NOT DETECTED
Opiates: NOT DETECTED
Tetrahydrocannabinol: NOT DETECTED

## 2017-10-12 LAB — CBC
HEMATOCRIT: 49.2 % (ref 39.0–52.0)
HEMOGLOBIN: 16.5 g/dL (ref 13.0–17.0)
MCH: 30.9 pg (ref 26.0–34.0)
MCHC: 33.5 g/dL (ref 30.0–36.0)
MCV: 92.1 fL (ref 78.0–100.0)
Platelets: 276 10*3/uL (ref 150–400)
RBC: 5.34 MIL/uL (ref 4.22–5.81)
RDW: 14 % (ref 11.5–15.5)
WBC: 11.5 10*3/uL — ABNORMAL HIGH (ref 4.0–10.5)

## 2017-10-12 LAB — BASIC METABOLIC PANEL WITH GFR
Anion gap: 12 (ref 5–15)
BUN: 12 mg/dL (ref 6–20)
CO2: 26 mmol/L (ref 22–32)
Calcium: 8.5 mg/dL — ABNORMAL LOW (ref 8.9–10.3)
Chloride: 101 mmol/L (ref 101–111)
Creatinine, Ser: 0.98 mg/dL (ref 0.61–1.24)
GFR calc Af Amer: >60 mL/min
GFR calc non Af Amer: >60 mL/min
Glucose, Bld: 110 mg/dL — ABNORMAL HIGH (ref 65–99)
Potassium: 3.2 mmol/L — ABNORMAL LOW (ref 3.5–5.1)
Sodium: 139 mmol/L (ref 135–145)

## 2017-10-12 MED ORDER — LISINOPRIL 2.5 MG PO TABS
5.0000 mg | ORAL_TABLET | Freq: Every day | ORAL | Status: DC
  Filled 2017-10-12: qty 2

## 2017-10-12 MED ORDER — POTASSIUM CHLORIDE CRYS ER 20 MEQ PO TBCR: 40.0000 meq | EXTENDED_RELEASE_TABLET | Freq: Once | ORAL | Status: DC

## 2017-10-12 MED ORDER — NITROGLYCERIN 0.4 MG SL SUBL: 0.4000 mg | SUBLINGUAL_TABLET | SUBLINGUAL | Status: DC | PRN

## 2017-10-12 MED ORDER — POTASSIUM CHLORIDE CRYS ER 20 MEQ PO TBCR
40.0000 meq | EXTENDED_RELEASE_TABLET | Freq: Two times a day (BID) | ORAL | Status: AC
  Administered 2017-10-12 (×2): 40 meq via ORAL
  Filled 2017-10-12: qty 2

## 2017-10-12 MED ORDER — CARVEDILOL 12.5 MG PO TABS
25.0000 mg | ORAL_TABLET | Freq: Two times a day (BID) | ORAL | Status: DC
  Administered 2017-10-12 – 2017-10-13 (×2): 25 mg via ORAL
  Filled 2017-10-12 (×2): qty 2

## 2017-10-12 MED ORDER — IOPAMIDOL (ISOVUE-370) INJECTION 76%
100.0000 mL | Freq: Once | INTRAVENOUS | Status: AC | PRN
  Administered 2017-10-12: 100 mL via INTRAVENOUS

## 2017-10-12 MED ORDER — ASPIRIN 81 MG PO CHEW
81.0000 mg | CHEWABLE_TABLET | ORAL | Status: AC
  Administered 2017-10-13: 81 mg via ORAL
  Filled 2017-10-12: qty 1

## 2017-10-12 MED ORDER — ISOSORBIDE MONONITRATE ER 60 MG PO TB24
30.0000 mg | ORAL_TABLET | Freq: Every day | ORAL | Status: DC
Start: 2017-10-12 — End: 2017-10-13
  Administered 2017-10-12: 30 mg via ORAL
  Filled 2017-10-12: qty 1

## 2017-10-12 MED ORDER — ENOXAPARIN SODIUM 40 MG/0.4ML ~~LOC~~ SOLN
40.0000 mg | SUBCUTANEOUS | Status: DC
  Administered 2017-10-12: 40 mg via SUBCUTANEOUS
  Filled 2017-10-12: qty 0.4

## 2017-10-12 MED ORDER — POTASSIUM CHLORIDE CRYS ER 20 MEQ PO TBCR
40.0000 meq | EXTENDED_RELEASE_TABLET | Freq: Every day | ORAL | Status: DC
  Filled 2017-10-12: qty 2

## 2017-10-12 MED ORDER — HYDRALAZINE HCL 25 MG PO TABS
25.0000 mg | ORAL_TABLET | Freq: Three times a day (TID) | ORAL | Status: DC
Start: 2017-10-12 — End: 2017-10-13
  Administered 2017-10-12 – 2017-10-13 (×4): 25 mg via ORAL
  Filled 2017-10-12 (×4): qty 1

## 2017-10-12 MED ORDER — SODIUM CHLORIDE 0.9% FLUSH: 3.0000 mL | INTRAVENOUS | Status: DC | PRN

## 2017-10-12 MED ORDER — LISINOPRIL 10 MG PO TABS
10.0000 mg | ORAL_TABLET | Freq: Every day | ORAL | Status: DC
  Administered 2017-10-12: 10 mg via ORAL
  Filled 2017-10-12: qty 1

## 2017-10-12 MED ORDER — SODIUM CHLORIDE 0.9% FLUSH
3.0000 mL | Freq: Two times a day (BID) | INTRAVENOUS | Status: DC
  Administered 2017-10-12: 3 mL via INTRAVENOUS

## 2017-10-12 MED ORDER — IOPAMIDOL (ISOVUE-370) INJECTION 76%
INTRAVENOUS | Status: AC
  Administered 2017-10-12: 12:00:00
  Filled 2017-10-12: qty 100

## 2017-10-12 MED ORDER — SODIUM CHLORIDE 0.9 % IV SOLN: 250.0000 mL | INTRAVENOUS | Status: DC | PRN

## 2017-10-12 MED ORDER — ENOXAPARIN SODIUM 40 MG/0.4ML ~~LOC~~ SOLN: 40.0000 mg | SUBCUTANEOUS | Status: DC

## 2017-10-12 MED ORDER — SODIUM CHLORIDE 0.9 % IV SOLN
INTRAVENOUS | Status: DC
  Administered 2017-10-13: 06:00:00 via INTRAVENOUS

## 2017-10-12 NOTE — H&P (View-Only) (Signed)
 Progress Note  Patient Name: John Olsen Date of Encounter: 10/12/2017  Primary Cardiologist:  Traci Turner, MD  Subjective   Breathing much better, edema much better. Wants to know why his heart is weak. Has been using the bathroom, no output recorded.  Inpatient Medications    Scheduled Meds: . enoxaparin (LOVENOX) injection  40 mg Subcutaneous Q24H  . furosemide  40 mg Intravenous BID  . lisinopril  10 mg Oral Daily  . metoprolol tartrate  50 mg Oral BID   Continuous Infusions: . nitroGLYCERIN 25 mcg/min (10/12/17 0800)   PRN Meds: hydrALAZINE, nitroGLYCERIN   Vital Signs    Vitals:   10/12/17 0630 10/12/17 0700 10/12/17 0800 10/12/17 0830  BP: (!) 150/96 121/83 (!) 207/104 (!) 161/79  Pulse: 94 76 88 87  Resp: (!) 30 14 (!) 21 (!) 30  Temp:   97.8 F (36.6 C)   TempSrc:   Oral   SpO2: 97% 91% 96% 96%  Weight:   207 lb 10.8 oz (94.2 kg)   Height:        Intake/Output Summary (Last 24 hours) at 10/12/2017 0856 Last data filed at 10/12/2017 0800 Gross per 24 hour  Intake 84.83 ml  Output -  Net 84.83 ml   Filed Weights   10/11/17 0209 10/11/17 2000 10/12/17 0800  Weight: 228 lb 12.8 oz (103.8 kg) 210 lb 12.2 oz (95.6 kg) 207 lb 10.8 oz (94.2 kg)    Telemetry    SR, ST - Personally Reviewed  ECG    04/22, ST, HR 104, LBBB - Personally Reviewed  Physical Exam   General: Well developed, well nourished, male appearing in no acute distress. Head: Normocephalic, atraumatic.  Neck: Supple without bruits, JVD minimal elevation. Lungs:  Resp regular and unlabored, CTA. Heart: RRR, S1, S2, no S3, S4, or murmur; no rub. Abdomen: Soft, non-tender, non-distended with normoactive bowel sounds. No hepatomegaly. No rebound/guarding. No obvious abdominal masses. Extremities: No clubbing, cyanosis, 1+ edema. Distal pedal pulses are 2+ bilaterally. Neuro: Alert and oriented X 3. Moves all extremities spontaneously. Psych: Normal affect.  Labs     Hematology Recent Labs  Lab 10/11/17 0226  WBC 8.8  RBC 4.67  HGB 14.5  HCT 43.4  MCV 92.9  MCH 31.0  MCHC 33.4  RDW 14.1  PLT 257    Chemistry Recent Labs  Lab 10/11/17 0226  NA 139  K 3.7  CL 106  CO2 22  GLUCOSE 114*  BUN 25*  CREATININE 1.17  CALCIUM 8.3*  PROT 5.9*  ALBUMIN 3.3*  AST 31  ALT 43  ALKPHOS 68  BILITOT 1.6*  GFRNONAA >60  GFRAA >60  ANIONGAP 11     Cardiac Enzymes Recent Labs  Lab 10/11/17 0226  TROPONINI <0.03    Recent Labs  Lab 10/11/17 0759  TROPIPOC 0.03     BNP Recent Labs  Lab 10/11/17 0226  BNP 1,070.0*     Radiology    Dg Chest 2 View  Result Date: 10/11/2017 CLINICAL DATA:  Acute onset of shortness of breath and difficulty sleeping. EXAM: CHEST - 2 VIEW COMPARISON:  None. FINDINGS: The lungs are well-aerated. Vascular congestion is noted. Increased interstitial markings raise concern for mild interstitial edema. There is no evidence of pleural effusion or pneumothorax. The heart is enlarged.  No acute osseous abnormalities are seen. IMPRESSION: Vascular congestion and cardiomegaly. Increased interstitial markings raise concern for mild interstitial edema. Electronically Signed   By: Jeffery  Chang M.D.   On:   10/11/2017 02:58   Ct Angio Chest Pe W Or Wo Contrast  Result Date: 10/11/2017 CLINICAL DATA:  Shortness of breath EXAM: CT ANGIOGRAPHY CHEST WITH CONTRAST TECHNIQUE: Multidetector CT imaging of the chest was performed using the standard protocol during bolus administration of intravenous contrast. Multiplanar CT image reconstructions and MIPs were obtained to evaluate the vascular anatomy. CONTRAST:  100mL ISOVUE-370 IOPAMIDOL (ISOVUE-370) INJECTION 76% COMPARISON:  Chest radiograph October 11, 2017 FINDINGS: Cardiovascular: There is no demonstrable pulmonary embolus. There is no thoracic aortic aneurysm. No dissection is seen. It must be noted that the contrast bolus in the aorta is not sufficient to entirely exclude  dissection as a potential radiographic differential consideration. The great vessels appear unremarkable. There is cardiomegaly with predominantly left-sided cardiac enlargement. There is a fairly small pericardial effusion present. The main pulmonary outflow tract measures 3.3 cm in diameter, abnormally prominent. Mediastinum/Nodes: Visualized thyroid appears normal. There are multiple mildly prominent lymph nodes throughout the mediastinum. In the aortopulmonary window region, largest lymph node measures 1.8 x 1.2 cm. In the right paratracheal region, largest lymph node measures 1.6 x 1.3 cm. In the right hilar region, largest lymph node measures 1.4 x 1.4 cm. In the subcarinal region on the right, there is a lymph node measuring 1.6 x 1.4 cm. No esophageal lesions are evident. Lungs/Pleura: There is interstitial edema, most notably in the lower lobes. There is patchy atelectatic change in the right upper lobe. A small amount of effusion is tracking along the major fissure on the right. No airspace consolidation is evident. Upper Abdomen: In the visualized upper abdomen, there is contrast reflux into the inferior vena cava and hepatic veins. Visualized upper abdominal structures otherwise appear unremarkable. Musculoskeletal: There are no blastic or lytic bone lesions. No evident chest wall lesion. Review of the MIP images confirms the above findings. IMPRESSION: 1.  No demonstrable pulmonary embolus. 2. Cardiomegaly with primarily left-sided cardiac enlargement. There is interstitial edema with a small right pleural effusion. These findings are felt to be indicative of a degree of underlying congestive heart failure. 3.  Small pericardial effusion. 4. Increase in main pulmonary outflow tract size, indicative of a degree of underlying pulmonary arterial hypertension. 5. Reflux of contrast into the inferior vena cava and hepatic veins, a finding likely indicative of increase in right heart pressure. 6.  No airspace  consolidation. 7. Several enlarged lymph nodes of uncertain etiology. These lymph nodes potentially may have reactive etiology. 8. No appreciable thoracic aortic aneurysm. No dissection seen. Note that the contrast bolus in the aorta is not sufficient for exclusion of dissection from a radiographic standpoint. Electronically Signed   By: William  Woodruff III M.D.   On: 10/11/2017 11:04     Cardiac Studies   ECHO:  10/11/2017 - Left ventricle: The cavity size was severely dilated. Wall   thickness was increased in a pattern of mild LVH. Systolic   function was severely reduced. The estimated ejection fraction   was in the range of 20% to 25%. Dyskinesis of the   basal-midanteroseptal myocardium. Doppler parameters are   consistent with a reversible restrictive pattern, indicative of   decreased left ventricular diastolic compliance and/or increased   left atrial pressure (grade 3 diastolic dysfunction). - Mitral valve: There was mild regurgitation. - Left atrium: The atrium was moderately dilated. - Pulmonary arteries: Systolic pressure was moderately to severely   increased. PA peak pressure: 64 mm Hg (S). - Pericardium, extracardiac: A trivial pericardial effusion was   identified.    Patient Profile     41 y.o. male w/ hx  HTN, obesity, who was admitted 04/22 for CHF (new dx), cards asked to see.  Assessment & Plan     Principal Problem: 1.  Acute combined systolic and diastolic congestive heart failure (HCC) - continue IV Lasix for now, may be able to change to po rx tomorrow - on BB, will add hydralazine and nitrates today - has already gotten lisinopril 10 mg today, was hoping to change to ARB and start Entresto soon - ck BMET daily  Active Problems: 2.  Hypertensive urgency - for abd CT today to look at renals and for abdominal masses  - continue to up-titrate meds.  Will go ahead and add DVT Lovenox for VTE prophylaxis. OK to tx telemetry  Signed, Rhonda Barrett ,  PA-C 8:56 AM 10/12/2017 Pager: [phone]  

## 2017-10-12 NOTE — Progress Notes (Signed)
     The risks and benefits of a cardiac catheterization including, but not limited to, death, stroke, MI, kidney damage and bleeding were discussed with the patient who indicates understanding and agrees to proceed. Orders written.  Theodore Demark, PA-C 10/12/2017 3:01 PM Beeper [phone]

## 2017-10-12 NOTE — Progress Notes (Addendum)
Progress Note  Patient Name: John Olsen Date of Encounter: 10/12/2017  Primary Cardiologist:  Armanda Magic, MD  Subjective   Breathing much better, edema much better. Wants to know why his heart is weak. Has been using the bathroom, no output recorded.  Inpatient Medications    Scheduled Meds: . enoxaparin (LOVENOX) injection  40 mg Subcutaneous Q24H  . furosemide  40 mg Intravenous BID  . lisinopril  10 mg Oral Daily  . metoprolol tartrate  50 mg Oral BID   Continuous Infusions: . nitroGLYCERIN 25 mcg/min (10/12/17 0800)   PRN Meds: hydrALAZINE, nitroGLYCERIN   Vital Signs    Vitals:   10/12/17 0630 10/12/17 0700 10/12/17 0800 10/12/17 0830  BP: (!) 150/96 121/83 (!) 207/104 (!) 161/79  Pulse: 94 76 88 87  Resp: (!) 30 14 (!) 21 (!) 30  Temp:   97.8 F (36.6 C)   TempSrc:   Oral   SpO2: 97% 91% 96% 96%  Weight:   207 lb 10.8 oz (94.2 kg)   Height:        Intake/Output Summary (Last 24 hours) at 10/12/2017 0856 Last data filed at 10/12/2017 0800 Gross per 24 hour  Intake 84.83 ml  Output -  Net 84.83 ml   Filed Weights   10/11/17 0209 10/11/17 2000 10/12/17 0800  Weight: 228 lb 12.8 oz (103.8 kg) 210 lb 12.2 oz (95.6 kg) 207 lb 10.8 oz (94.2 kg)    Telemetry    SR, ST - Personally Reviewed  ECG    04/22, ST, HR 104, LBBB - Personally Reviewed  Physical Exam   General: Well developed, well nourished, male appearing in no acute distress. Head: Normocephalic, atraumatic.  Neck: Supple without bruits, JVD minimal elevation. Lungs:  Resp regular and unlabored, CTA. Heart: RRR, S1, S2, no S3, S4, or murmur; no rub. Abdomen: Soft, non-tender, non-distended with normoactive bowel sounds. No hepatomegaly. No rebound/guarding. No obvious abdominal masses. Extremities: No clubbing, cyanosis, 1+ edema. Distal pedal pulses are 2+ bilaterally. Neuro: Alert and oriented X 3. Moves all extremities spontaneously. Psych: Normal affect.  Labs     Hematology Recent Labs  Lab 10/11/17 0226  WBC 8.8  RBC 4.67  HGB 14.5  HCT 43.4  MCV 92.9  MCH 31.0  MCHC 33.4  RDW 14.1  PLT 257    Chemistry Recent Labs  Lab 10/11/17 0226  NA 139  K 3.7  CL 106  CO2 22  GLUCOSE 114*  BUN 25*  CREATININE 1.17  CALCIUM 8.3*  PROT 5.9*  ALBUMIN 3.3*  AST 31  ALT 43  ALKPHOS 68  BILITOT 1.6*  GFRNONAA >60  GFRAA >60  ANIONGAP 11     Cardiac Enzymes Recent Labs  Lab 10/11/17 0226  TROPONINI <0.03    Recent Labs  Lab 10/11/17 0759  TROPIPOC 0.03     BNP Recent Labs  Lab 10/11/17 0226  BNP 1,070.0*     Radiology    Dg Chest 2 View  Result Date: 10/11/2017 CLINICAL DATA:  Acute onset of shortness of breath and difficulty sleeping. EXAM: CHEST - 2 VIEW COMPARISON:  None. FINDINGS: The lungs are well-aerated. Vascular congestion is noted. Increased interstitial markings raise concern for mild interstitial edema. There is no evidence of pleural effusion or pneumothorax. The heart is enlarged.  No acute osseous abnormalities are seen. IMPRESSION: Vascular congestion and cardiomegaly. Increased interstitial markings raise concern for mild interstitial edema. Electronically Signed   By: Roanna Raider M.D.   On:  10/11/2017 02:58   Ct Angio Chest Pe W Or Wo Contrast  Result Date: 10/11/2017 CLINICAL DATA:  Shortness of breath EXAM: CT ANGIOGRAPHY CHEST WITH CONTRAST TECHNIQUE: Multidetector CT imaging of the chest was performed using the standard protocol during bolus administration of intravenous contrast. Multiplanar CT image reconstructions and MIPs were obtained to evaluate the vascular anatomy. CONTRAST:  ISOVUE-370 IOPAMIDOL (ISOVUE-370) INJECTION 76% COMPARISON:  Chest radiograph October 11, 2017 FINDINGS: Cardiovascular: There is no demonstrable pulmonary embolus. There is no thoracic aortic aneurysm. No dissection is seen. It must be noted that the contrast bolus in the aorta is not sufficient to entirely exclude  dissection as a potential radiographic differential consideration. The great vessels appear unremarkable. There is cardiomegaly with predominantly left-sided cardiac enlargement. There is a fairly small pericardial effusion present. The main pulmonary outflow tract measures 3.3 cm in diameter, abnormally prominent. Mediastinum/Nodes: Visualized thyroid appears normal. There are multiple mildly prominent lymph nodes throughout the mediastinum. In the aortopulmonary window region, largest lymph node measures 1.8 x 1.2 cm. In the right paratracheal region, largest lymph node measures 1.6 x 1.3 cm. In the right hilar region, largest lymph node measures 1.4 x 1.4 cm. In the subcarinal region on the right, there is a lymph node measuring 1.6 x 1.4 cm. No esophageal lesions are evident. Lungs/Pleura: There is interstitial edema, most notably in the lower lobes. There is patchy atelectatic change in the right upper lobe. A small amount of effusion is tracking along the major fissure on the right. No airspace consolidation is evident. Upper Abdomen: In the visualized upper abdomen, there is contrast reflux into the inferior vena cava and hepatic veins. Visualized upper abdominal structures otherwise appear unremarkable. Musculoskeletal: There are no blastic or lytic bone lesions. No evident chest wall lesion. Review of the MIP images confirms the above findings. IMPRESSION: 1.  No demonstrable pulmonary embolus. 2. Cardiomegaly with primarily left-sided cardiac enlargement. There is interstitial edema with a small right pleural effusion. These findings are felt to be indicative of a degree of underlying congestive heart failure. 3.  Small pericardial effusion. 4. Increase in main pulmonary outflow tract size, indicative of a degree of underlying pulmonary arterial hypertension. 5. Reflux of contrast into the inferior vena cava and hepatic veins, a finding likely indicative of increase in right heart pressure. 6.  No airspace  consolidation. 7. Several enlarged lymph nodes of uncertain etiology. These lymph nodes potentially may have reactive etiology. 8. No appreciable thoracic aortic aneurysm. No dissection seen. Note that the contrast bolus in the aorta is not sufficient for exclusion of dissection from a radiographic standpoint. Electronically Signed   By: Bretta Bang III M.D.   On: 10/11/2017 11:04     Cardiac Studies   ECHO:  10/11/2017 - Left ventricle: The cavity size was severely dilated. Wall   thickness was increased in a pattern of mild LVH. Systolic   function was severely reduced. The estimated ejection fraction   was in the range of 20% to 25%. Dyskinesis of the   basal-midanteroseptal myocardium. Doppler parameters are   consistent with a reversible restrictive pattern, indicative of   decreased left ventricular diastolic compliance and/or increased   left atrial pressure (grade 3 diastolic dysfunction). - Mitral valve: There was mild regurgitation. - Left atrium: The atrium was moderately dilated. - Pulmonary arteries: Systolic pressure was moderately to severely   increased. PA peak pressure: 64 mm Hg (S). - Pericardium, extracardiac: A trivial pericardial effusion was   identified.  Patient Profile     42 y.o. male w/ hx  HTN, obesity, who was admitted 04/22 for CHF (new dx), cards asked to see.  Assessment & Plan     Principal Problem: 1.  Acute combined systolic and diastolic congestive heart failure (HCC) - continue IV Lasix for now, may be able to change to po rx tomorrow - on BB, will add hydralazine and nitrates today - has already gotten lisinopril 10 mg today, was hoping to change to ARB and start Entresto soon - ck BMET daily  Active Problems: 2.  Hypertensive urgency - for abd CT today to look at renals and for abdominal masses  - continue to up-titrate meds.  Will go ahead and add DVT Lovenox for VTE prophylaxis. OK to tx telemetry  Signed, Theodore Demark ,  PA-C 8:56 AM 10/12/2017 Pager: [phone]

## 2017-10-12 NOTE — Progress Notes (Signed)
[facility] PROGRESS NOTE    Progress Note  John Olsen  WOE:[id number] DOB: [birth year] DOA: 10/11/2017 PCP: Patient, No Pcp Per     Brief Narrative:   John Olsen is an 42 y.o. male past medical history of essential hypertension comes in complaining of worsening shortness of breath over the last 3-4 weeks with exertional dyspnea, orthopnea bilateral lower extremity edema and cough.  Assessment/Plan:   Acute combined systolic and diastolic congestive heart failure (HCC) 2D echo was done that showed both systolic and diastolic heart failure. He was started on IV Lasix, Norvasc and metoprolol with IV hydralazine as needed. Will DC Norvasc. Creatinine has remained stable start him on low-dose lisinopril. He feels seems to be fluid overloaded will titrate nitroglycerin down Hydralazine IV as needed, nitroglycerin sublingual as needed for chest pain. Check a UDS  Hypertensive urgency: Blood pressure is much improved today, will continue IV Lasix, discontinue Norvasc, start him on lisinopril, continue metoprolol.   DVT prophylaxis: loenox Family Communication: none Disposition Plan/Barrier to D/C: home once diuresed Code Status:     Code Status Orders  (From admission, onward)        Start     Ordered   10/11/17 1022  Full code  Continuous     10/11/17 1021    Code Status History    This patient has a current code status but no historical code status.        IV Access:    Peripheral IV   Procedures and diagnostic studies:   Dg Chest 2 View  Result Date: 10/11/2017 CLINICAL DATA:  Acute onset of shortness of breath and difficulty sleeping. EXAM: CHEST - 2 VIEW COMPARISON:  None. FINDINGS: The lungs are well-aerated. Vascular congestion is noted. Increased interstitial markings raise concern for mild interstitial edema. There is no evidence of pleural effusion or pneumothorax. The heart is enlarged.  No acute osseous abnormalities are seen. IMPRESSION:  Vascular congestion and cardiomegaly. Increased interstitial markings raise concern for mild interstitial edema. Electronically Signed   By: Roanna Raider M.D.   On: 10/11/2017 02:58   Ct Angio Chest Pe W Or Wo Contrast  Result Date: 10/11/2017 CLINICAL DATA:  Shortness of breath EXAM: CT ANGIOGRAPHY CHEST WITH CONTRAST TECHNIQUE: Multidetector CT imaging of the chest was performed using the standard protocol during bolus administration of intravenous contrast. Multiplanar CT image reconstructions and MIPs were obtained to evaluate the vascular anatomy. CONTRAST:  ISOVUE-370 IOPAMIDOL (ISOVUE-370) INJECTION 76% COMPARISON:  Chest radiograph October 11, 2017 FINDINGS: Cardiovascular: There is no demonstrable pulmonary embolus. There is no thoracic aortic aneurysm. No dissection is seen. It must be noted that the contrast bolus in the aorta is not sufficient to entirely exclude dissection as a potential radiographic differential consideration. The great vessels appear unremarkable. There is cardiomegaly with predominantly left-sided cardiac enlargement. There is a fairly small pericardial effusion present. The main pulmonary outflow tract measures 3.3 cm in diameter, abnormally prominent. Mediastinum/Nodes: Visualized thyroid appears normal. There are multiple mildly prominent lymph nodes throughout the mediastinum. In the aortopulmonary window region, largest lymph node measures 1.8 x 1.2 cm. In the right paratracheal region, largest lymph node measures 1.6 x 1.3 cm. In the right hilar region, largest lymph node measures 1.4 x 1.4 cm. In the subcarinal region on the right, there is a lymph node measuring 1.6 x 1.4 cm. No esophageal lesions are evident. Lungs/Pleura: There is interstitial edema, most notably in the lower lobes. There is patchy atelectatic change in the  right upper lobe. A small amount of effusion is tracking along the major fissure on the right. No airspace consolidation is evident. Upper  Abdomen: In the visualized upper abdomen, there is contrast reflux into the inferior vena cava and hepatic veins. Visualized upper abdominal structures otherwise appear unremarkable. Musculoskeletal: There are no blastic or lytic bone lesions. No evident chest wall lesion. Review of the MIP images confirms the above findings. IMPRESSION: 1.  No demonstrable pulmonary embolus. 2. Cardiomegaly with primarily left-sided cardiac enlargement. There is interstitial edema with a small right pleural effusion. These findings are felt to be indicative of a degree of underlying congestive heart failure. 3.  Small pericardial effusion. 4. Increase in main pulmonary outflow tract size, indicative of a degree of underlying pulmonary arterial hypertension. 5. Reflux of contrast into the inferior vena cava and hepatic veins, a finding likely indicative of increase in right heart pressure. 6.  No airspace consolidation. 7. Several enlarged lymph nodes of uncertain etiology. These lymph nodes potentially may have reactive etiology. 8. No appreciable thoracic aortic aneurysm. No dissection seen. Note that the contrast bolus in the aorta is not sufficient for exclusion of dissection from a radiographic standpoint. Electronically Signed   By: Bretta Bang III M.D.   On: 10/11/2017 11:04     Medical Consultants:    None.  Anti-Infectives:   None  Subjective:    John Olsen he relates his breathing is much improved.  Objective:    Vitals:   10/12/17 0530 10/12/17 0600 10/12/17 0630 10/12/17 0700  BP: 116/61 (!) 142/101 (!) 150/96 121/83  Pulse: 85 89 94 76  Resp: (!) 26 (!) 21 (!) 30 14  Temp:      TempSrc:      SpO2: 96% 97% 97% 91%  Weight:      Height:        Intake/Output Summary (Last 24 hours) at 10/12/2017 0720 Last data filed at 10/12/2017 0600 Gross per 24 hour  Intake 77.33 ml  Output -  Net 77.33 ml   Filed Weights   10/11/17 0209 10/11/17 2000  Weight: 103.8 kg (228 lb 12.8 oz)  95.6 kg (210 lb 12.2 oz)    Exam: General exam: In no acute distress. Respiratory system: Good air movement and crackles at bases at right lung base Cardiovascular system: S1 & S2 heard, RRR. +JVD.  Gastrointestinal system: Abdomen is nondistended, soft and nontender.  Central nervous system: Alert and oriented. No focal neurological deficits. Extremities: Trace edema Skin: No rashes, lesions or ulcers Psychiatry: Judgement and insight appear normal. Mood & affect appropriate.    Data Reviewed:    Labs: Basic Metabolic Panel: Recent Labs  Lab 10/11/17 0226  NA 139  K 3.7  CL 106  CO2 22  GLUCOSE 114*  BUN 25*  CREATININE 1.17  CALCIUM 8.3*   GFR Estimated Creatinine Clearance: 91.7 mL/min (by C-G formula based on SCr of 1.17 mg/dL). Liver Function Tests: Recent Labs  Lab 10/11/17 0226  AST 31  ALT 43  ALKPHOS 68  BILITOT 1.6*  PROT 5.9*  ALBUMIN 3.3*   No results for input(s): LIPASE, AMYLASE in the last 168 hours. No results for input(s): AMMONIA in the last 168 hours. Coagulation profile No results for input(s): INR, PROTIME in the last 168 hours.  CBC: Recent Labs  Lab 10/11/17 0226  WBC 8.8  HGB 14.5  HCT 43.4  MCV 92.9  PLT 257   Cardiac Enzymes: Recent Labs  Lab 10/11/17 0226  TROPONINI <0.03   BNP (last 3 results) No results for input(s): PROBNP in the last 8760 hours. CBG: No results for input(s): GLUCAP in the last 168 hours. D-Dimer: No results for input(s): DDIMER in the last 72 hours. Hgb A1c: Recent Labs    10/11/17 1139  HGBA1C 5.6   Lipid Profile: Recent Labs    10/11/17 0226  CHOL 121  HDL 29*  LDLCALC 78  TRIG 72  CHOLHDL 4.2   Thyroid function studies: Recent Labs    10/11/17 1139  TSH 3.370   Anemia work up: No results for input(s): VITAMINB12, FOLATE, FERRITIN, TIBC, IRON, RETICCTPCT in the last 72 hours. Sepsis Labs: Recent Labs  Lab 10/11/17 0226  WBC 8.8   Microbiology Recent Results (from the  past 240 hour(s))  MRSA PCR Screening     Status: None   Collection Time: 10/11/17  8:39 PM  Result Value Ref Range Status   MRSA by PCR NEGATIVE NEGATIVE Final    Comment:        The GeneXpert MRSA Assay (FDA approved for NASAL specimens only), is one component of a comprehensive MRSA colonization surveillance program. It is not intended to diagnose MRSA infection nor to guide or monitor treatment for MRSA infections. Performed at [facility], [address], [place], [place] [address]      Medications:   . amLODipine  10 mg Oral Daily  . furosemide  40 mg Intravenous BID  . metoprolol tartrate  50 mg Oral BID   Continuous Infusions: . nitroGLYCERIN 25 mcg/min (10/12/17 0545)      LOS: 1 day   Marinda Elk  [facility] Pager [phone]  *Please refer to [email or web address], password [email or web address] to get updated schedule on who will round on this patient, as hospitalists switch teams weekly. If 7PM-7AM, please contact night-coverage at [email or web address], password [email or web address] for any overnight needs.  10/12/2017, 7:20 AM

## 2017-10-13 ENCOUNTER — Inpatient Hospital Stay (HOSPITAL_COMMUNITY)
Admission: EM | Disposition: A | Discharge status: Home / Self Care | Payer: Self-pay | Source: Home / Self Care | Attending: Internal Medicine

## 2017-10-13 DIAGNOSIS — I5020 Unspecified systolic (congestive) heart failure: Secondary | ICD-10-CM

## 2017-10-13 HISTORY — PX: RIGHT/LEFT HEART CATH AND CORONARY ANGIOGRAPHY: CATH118266

## 2017-10-13 HISTORY — PX: ULTRASOUND GUIDANCE FOR VASCULAR ACCESS: SHX6516

## 2017-10-13 LAB — POCT I-STAT 3, VENOUS BLOOD GAS (G3P V)
BICARBONATE: 26.4 mmol/L (ref 20.0–28.0)
O2 Saturation: 69 %
PCO2 VEN: 46.9 mmHg (ref 44.0–60.0)
PH VEN: 7.357 (ref 7.250–7.430)
PO2 VEN: 38 mmHg (ref 32.0–45.0)
TCO2: 28 mmol/L (ref 22–32)

## 2017-10-13 LAB — BASIC METABOLIC PANEL
ANION GAP: 11 (ref 5–15)
BUN: 15 mg/dL (ref 6–20)
CHLORIDE: 105 mmol/L (ref 101–111)
CO2: 24 mmol/L (ref 22–32)
Calcium: 8.3 mg/dL — ABNORMAL LOW (ref 8.9–10.3)
Creatinine, Ser: 0.91 mg/dL (ref 0.61–1.24)
GFR calc Af Amer: >60 mL/min (ref >60)
GFR calc non Af Amer: >60 mL/min (ref >60)
GLUCOSE: 97 mg/dL (ref 65–99)
POTASSIUM: 3.7 mmol/L (ref 3.5–5.1)
Sodium: 140 mmol/L (ref 135–145)

## 2017-10-13 LAB — PROTIME-INR
INR: 1.1
Prothrombin Time: 14.1 seconds (ref 11.4–15.2)

## 2017-10-13 LAB — CBC
HCT: 46.4 % (ref 39.0–52.0)
HEMOGLOBIN: 15.4 g/dL (ref 13.0–17.0)
MCH: 31.2 pg (ref 26.0–34.0)
MCHC: 33.2 g/dL (ref 30.0–36.0)
MCV: 94.1 fL (ref 78.0–100.0)
Platelets: 285 10*3/uL (ref 150–400)
RBC: 4.93 MIL/uL (ref 4.22–5.81)
RDW: 14.3 % (ref 11.5–15.5)
WBC: 6.8 10*3/uL (ref 4.0–10.5)

## 2017-10-13 LAB — CREATININE, SERUM
Creatinine, Ser: 1.02 mg/dL (ref 0.61–1.24)
GFR calc non Af Amer: >60 mL/min (ref >60)

## 2017-10-13 SURGERY — RIGHT/LEFT HEART CATH AND CORONARY ANGIOGRAPHY
Anesthesia: LOCAL

## 2017-10-13 MED ORDER — FENTANYL CITRATE (PF) 100 MCG/2ML IJ SOLN
INTRAMUSCULAR | Status: DC | PRN
  Administered 2017-10-13: 50 ug via INTRAVENOUS

## 2017-10-13 MED ORDER — MIDAZOLAM HCL 2 MG/2ML IJ SOLN
INTRAMUSCULAR | Status: DC | PRN
  Administered 2017-10-13: 1 mg via INTRAVENOUS

## 2017-10-13 MED ORDER — SPIRONOLACTONE 12.5 MG HALF TABLET
12.5000 mg | ORAL_TABLET | Freq: Every day | ORAL | Status: DC
  Administered 2017-10-13 – 2017-10-14 (×2): 12.5 mg via ORAL
  Filled 2017-10-13 (×2): qty 1

## 2017-10-13 MED ORDER — ONDANSETRON HCL 4 MG/2ML IJ SOLN: 4.0000 mg | Freq: Four times a day (QID) | INTRAMUSCULAR | Status: DC | PRN

## 2017-10-13 MED ORDER — FENTANYL CITRATE (PF) 100 MCG/2ML IJ SOLN
INTRAMUSCULAR | Status: AC
  Filled 2017-10-13: qty 2

## 2017-10-13 MED ORDER — ACETAMINOPHEN 325 MG PO TABS: 650.0000 mg | ORAL_TABLET | ORAL | Status: DC | PRN

## 2017-10-13 MED ORDER — SODIUM CHLORIDE 0.9 % IV SOLN: 250.0000 mL | INTRAVENOUS | Status: DC | PRN

## 2017-10-13 MED ORDER — FUROSEMIDE 10 MG/ML IJ SOLN: 40.0000 mg | Freq: Three times a day (TID) | INTRAMUSCULAR | Status: DC

## 2017-10-13 MED ORDER — FUROSEMIDE 40 MG PO TABS
40.0000 mg | ORAL_TABLET | Freq: Every day | ORAL | Status: DC
  Administered 2017-10-14: 40 mg via ORAL
  Filled 2017-10-13: qty 1

## 2017-10-13 MED ORDER — POTASSIUM CHLORIDE CRYS ER 20 MEQ PO TBCR
20.0000 meq | EXTENDED_RELEASE_TABLET | Freq: Once | ORAL | Status: AC
  Administered 2017-10-13: 20 meq via ORAL
  Filled 2017-10-13: qty 1

## 2017-10-13 MED ORDER — SODIUM CHLORIDE 0.9% FLUSH
3.0000 mL | Freq: Two times a day (BID) | INTRAVENOUS | Status: DC
  Administered 2017-10-14: 3 mL via INTRAVENOUS

## 2017-10-13 MED ORDER — SODIUM CHLORIDE 0.9% FLUSH: 3.0000 mL | INTRAVENOUS | Status: DC | PRN

## 2017-10-13 MED ORDER — HEPARIN (PORCINE) IN NACL 1000-0.9 UT/500ML-% IV SOLN
INTRAVENOUS | Status: AC
  Filled 2017-10-13: qty 1000

## 2017-10-13 MED ORDER — ENOXAPARIN SODIUM 40 MG/0.4ML ~~LOC~~ SOLN
40.0000 mg | SUBCUTANEOUS | Status: DC
  Administered 2017-10-14: 40 mg via SUBCUTANEOUS
  Filled 2017-10-13: qty 0.4

## 2017-10-13 MED ORDER — ASPIRIN 81 MG PO CHEW
81.0000 mg | CHEWABLE_TABLET | Freq: Every day | ORAL | Status: DC
  Administered 2017-10-13: 81 mg via ORAL
  Filled 2017-10-13: qty 1

## 2017-10-13 MED ORDER — MIDAZOLAM HCL 2 MG/2ML IJ SOLN
INTRAMUSCULAR | Status: AC
  Filled 2017-10-13: qty 2

## 2017-10-13 MED ORDER — CARVEDILOL 3.125 MG PO TABS
3.1250 mg | ORAL_TABLET | Freq: Two times a day (BID) | ORAL | Status: DC
  Administered 2017-10-13 – 2017-10-14 (×2): 3.125 mg via ORAL
  Filled 2017-10-13 (×2): qty 1

## 2017-10-13 MED ORDER — HYDRALAZINE HCL 10 MG PO TABS: 10.0000 mg | ORAL_TABLET | Freq: Three times a day (TID) | ORAL | Status: DC

## 2017-10-13 MED ORDER — HEPARIN SODIUM (PORCINE) 1000 UNIT/ML IJ SOLN
INTRAMUSCULAR | Status: DC | PRN
  Administered 2017-10-13: 4000 [IU] via INTRAVENOUS

## 2017-10-13 MED ORDER — LIVING BETTER WITH HEART FAILURE BOOK: Freq: Once | Status: DC

## 2017-10-13 MED ORDER — LISINOPRIL 5 MG PO TABS
5.0000 mg | ORAL_TABLET | Freq: Every day | ORAL | Status: DC
  Administered 2017-10-14: 5 mg via ORAL
  Filled 2017-10-13: qty 1

## 2017-10-13 MED ORDER — HEPARIN SODIUM (PORCINE) 1000 UNIT/ML IJ SOLN
INTRAMUSCULAR | Status: AC
  Filled 2017-10-13: qty 1

## 2017-10-13 MED ORDER — LIDOCAINE HCL (PF) 1 % IJ SOLN
INTRAMUSCULAR | Status: AC
  Filled 2017-10-13: qty 30

## 2017-10-13 MED ORDER — SODIUM CHLORIDE 0.9 % IV SOLN: INTRAVENOUS | Status: AC

## 2017-10-13 MED ORDER — LIDOCAINE HCL (PF) 1 % IJ SOLN
INTRAMUSCULAR | Status: DC | PRN
  Administered 2017-10-13: 2 mL

## 2017-10-13 MED ORDER — IOPAMIDOL (ISOVUE-370) INJECTION 76%
INTRAVENOUS | Status: AC
  Filled 2017-10-13: qty 100

## 2017-10-13 MED ORDER — IOPAMIDOL (ISOVUE-370) INJECTION 76%
INTRAVENOUS | Status: DC | PRN
  Administered 2017-10-13: 50 mL via INTRA_ARTERIAL

## 2017-10-13 MED ORDER — POTASSIUM CHLORIDE CRYS ER 20 MEQ PO TBCR
40.0000 meq | EXTENDED_RELEASE_TABLET | Freq: Every day | ORAL | Status: DC
  Administered 2017-10-13 – 2017-10-14 (×2): 40 meq via ORAL
  Filled 2017-10-13 (×2): qty 2

## 2017-10-13 MED ORDER — HEPARIN (PORCINE) IN NACL 2-0.9 UNITS/ML
INTRAMUSCULAR | Status: AC | PRN
  Administered 2017-10-13 (×4): 500 mL

## 2017-10-13 MED ORDER — VERAPAMIL HCL 2.5 MG/ML IV SOLN
INTRAVENOUS | Status: AC
  Filled 2017-10-13: qty 2

## 2017-10-13 MED ORDER — VERAPAMIL HCL 2.5 MG/ML IV SOLN
INTRAVENOUS | Status: DC | PRN
  Administered 2017-10-13: 10 mL via INTRA_ARTERIAL

## 2017-10-13 SURGICAL SUPPLY — 17 items
BAND ZEPHYR COMPRESS 30 LONG (HEMOSTASIS) ×3 IMPLANT
CATH BALLN WEDGE 5F 110CM (CATHETERS) ×3 IMPLANT
CATH INFINITI 5 FR JL3.5 (CATHETERS) ×3 IMPLANT
CATH INFINITI 5FR MPB2 (CATHETERS) ×3 IMPLANT
CATH INFINITI JR4 5F (CATHETERS) ×3 IMPLANT
COVER PRB 48X5XTLSCP FOLD TPE (BAG) ×2 IMPLANT
COVER PROBE 5X48 (BAG) ×1
ELECT DEFIB PAD ADLT CADENCE (PAD) ×3 IMPLANT
GLIDESHEATH SLEND A-KIT 6F 22G (SHEATH) ×3 IMPLANT
GUIDEWIRE .025 260CM (WIRE) ×3 IMPLANT
GUIDEWIRE INQWIRE 1.5J.035X260 (WIRE) ×2 IMPLANT
INQWIRE 1.5J .035X260CM (WIRE) ×3
KIT HEART LEFT (KITS) ×3 IMPLANT
PACK CARDIAC CATHETERIZATION (CUSTOM PROCEDURE TRAY) ×3 IMPLANT
SHEATH GLIDE SLENDER 4/5FR (SHEATH) ×3 IMPLANT
TRANSDUCER W/STOPCOCK (MISCELLANEOUS) ×3 IMPLANT
TUBING CIL FLEX 10 FLL-RA (TUBING) ×3 IMPLANT

## 2017-10-13 NOTE — Progress Notes (Signed)
Progress Note  Patient Name: John Olsen Date of Encounter: 10/13/2017  Primary Cardiologist: New, Dr Mayford Knife  Subjective   Seen post-cath, feeling well. Slept better last night. Denies any edema.  Inpatient Medications    Scheduled Meds: . carvedilol  25 mg Oral BID WC  . enoxaparin (LOVENOX) injection  40 mg Subcutaneous Q24H  . furosemide  40 mg Intravenous BID  . hydrALAZINE  25 mg Oral Q8H  . isosorbide mononitrate  30 mg Oral Daily  . lisinopril  10 mg Oral Daily  . potassium chloride  40 mEq Oral Daily  . sodium chloride flush  3 mL Intravenous Q12H   Continuous Infusions: . sodium chloride    . sodium chloride 50 mL/hr at 10/13/17 0538   PRN Meds: sodium chloride, hydrALAZINE, nitroGLYCERIN, sodium chloride flush   Vital Signs    Vitals:   10/13/17 0600 10/13/17 0638 10/13/17 0702 10/13/17 0800  BP: 117/79   (!) 128/95  Pulse: 81 79  64  Resp: 11 14  16   Temp:   98.1 F (36.7 C)   TempSrc:   Oral   SpO2: (!) 88% 93%  93%  Weight:      Height:        Intake/Output Summary (Last 24 hours) at 10/13/2017 1021 Last data filed at 10/13/2017 0900 Gross per 24 hour  Intake 808.33 ml  Output -  Net 808.33 ml   Filed Weights   10/12/17 0830 10/12/17 2042 10/13/17 0102  Weight: 206 lb 5.6 oz (93.6 kg) 206 lb 12.7 oz (93.8 kg) 206 lb 12.7 oz (93.8 kg)    Telemetry    NSR in holding area. Telemetry did not cross over from [facility]. Spoke with Dr. Mayford Knife who reviewed from [facility] - NSR with LBBB.  Physical Exam   GEN: No acute distress, obese WM NAD HEENT: Normocephalic, atraumatic, sclera non-icteric. Neck: No JVD or bruits. Cardiac: RRR no murmurs, rubs, or gallops.  Radials/DP/PT 1+ and equal bilaterally.  Respiratory: Clear to auscultation bilaterally. Breathing is unlabored. GI: Soft, nontender, non-distended, BS +x 4. MS: no deformity. Extremities: No clubbing or cyanosis. No edema. Distal pedal pulses are 2+ and equal bilaterally. Neuro:  AAOx3. Follows  commands. Psych:  Responds to questions appropriately with a normal affect.  Labs    Chemistry Recent Labs  Lab 10/11/17 0226 10/12/17 0929 10/13/17 0323  NA 139 139 140  K 3.7 3.2* 3.7  CL 106 101 105  CO2 22 26 24   GLUCOSE 114* 110* 97  BUN 25* 12 15  CREATININE 1.17 0.98 0.91  CALCIUM 8.3* 8.5* 8.3*  PROT 5.9*  --   --   ALBUMIN 3.3*  --   --   AST 31  --   --   ALT 43  --   --   ALKPHOS 68  --   --   BILITOT 1.6*  --   --   GFRNONAA >60 >60 >60  GFRAA >60 >60 >60  ANIONGAP 11 12 11      Hematology Recent Labs  Lab 10/11/17 0226 10/12/17 0929  WBC 8.8 11.5*  RBC 4.67 5.34  HGB 14.5 16.5  HCT 43.4 49.2  MCV 92.9 92.1  MCH 31.0 30.9  MCHC 33.4 33.5  RDW 14.1 14.0  PLT 257 276    Cardiac Enzymes Recent Labs  Lab 10/11/17 0226  TROPONINI <0.03    Recent Labs  Lab 10/11/17 0759  TROPIPOC 0.03     BNP Recent Labs  Lab 10/11/17  0226  BNP 1,070.0*     DDimer No results for input(s): DDIMER in the last 168 hours.   Radiology    Ct Angio Chest Pe W Or Wo Contrast  Result Date: 10/11/2017 CLINICAL DATA:  Shortness of breath EXAM: CT ANGIOGRAPHY CHEST WITH CONTRAST TECHNIQUE: Multidetector CT imaging of the chest was performed using the standard protocol during bolus administration of intravenous contrast. Multiplanar CT image reconstructions and MIPs were obtained to evaluate the vascular anatomy. CONTRAST:  ISOVUE-370 IOPAMIDOL (ISOVUE-370) INJECTION 76% COMPARISON:  Chest radiograph October 11, 2017 FINDINGS: Cardiovascular: There is no demonstrable pulmonary embolus. There is no thoracic aortic aneurysm. No dissection is seen. It must be noted that the contrast bolus in the aorta is not sufficient to entirely exclude dissection as a potential radiographic differential consideration. The great vessels appear unremarkable. There is cardiomegaly with predominantly left-sided cardiac enlargement. There is a fairly small pericardial effusion present.  The main pulmonary outflow tract measures 3.3 cm in diameter, abnormally prominent. Mediastinum/Nodes: Visualized thyroid appears normal. There are multiple mildly prominent lymph nodes throughout the mediastinum. In the aortopulmonary window region, largest lymph node measures 1.8 x 1.2 cm. In the right paratracheal region, largest lymph node measures 1.6 x 1.3 cm. In the right hilar region, largest lymph node measures 1.4 x 1.4 cm. In the subcarinal region on the right, there is a lymph node measuring 1.6 x 1.4 cm. No esophageal lesions are evident. Lungs/Pleura: There is interstitial edema, most notably in the lower lobes. There is patchy atelectatic change in the right upper lobe. A small amount of effusion is tracking along the major fissure on the right. No airspace consolidation is evident. Upper Abdomen: In the visualized upper abdomen, there is contrast reflux into the inferior vena cava and hepatic veins. Visualized upper abdominal structures otherwise appear unremarkable. Musculoskeletal: There are no blastic or lytic bone lesions. No evident chest wall lesion. Review of the MIP images confirms the above findings. IMPRESSION: 1.  No demonstrable pulmonary embolus. 2. Cardiomegaly with primarily left-sided cardiac enlargement. There is interstitial edema with a small right pleural effusion. These findings are felt to be indicative of a degree of underlying congestive heart failure. 3.  Small pericardial effusion. 4. Increase in main pulmonary outflow tract size, indicative of a degree of underlying pulmonary arterial hypertension. 5. Reflux of contrast into the inferior vena cava and hepatic veins, a finding likely indicative of increase in right heart pressure. 6.  No airspace consolidation. 7. Several enlarged lymph nodes of uncertain etiology. These lymph nodes potentially may have reactive etiology. 8. No appreciable thoracic aortic aneurysm. No dissection seen. Note that the contrast bolus in the  aorta is not sufficient for exclusion of dissection from a radiographic standpoint. Electronically Signed   By: Bretta Bang III M.D.   On: 10/11/2017 11:04   Ct Angio Abdomen W &/or Wo Contrast  Result Date: 10/12/2017 CLINICAL DATA:  Uncontrolled hypertension EXAM: CT ANGIOGRAPHY ABDOMEN TECHNIQUE: Multidetector CT imaging of the abdomen was performed using the standard protocol during bolus administration of intravenous contrast. Multiplanar reconstructed images and MIPs were obtained and reviewed to evaluate the vascular anatomy. CONTRAST:  ISOVUE-370 IOPAMIDOL (ISOVUE-370) INJECTION 76% COMPARISON:  None. FINDINGS: VASCULAR Aorta: Nonaneurysmal and patent. Celiac: Patent.  Branch vessels patent. SMA: Patent branch vessels grossly patent. Renals: 3 right renal arteries and a single left renal artery are patent. There is no evidence for fibromuscular dysplasia. IMA: Patent Inflow: Common iliac arteries are patent and tortuous. Veins:  No obvious DVT. Review of the MIP images confirms the above findings. NON-VASCULAR Lower chest: Dependent atelectasis.  Small pericardial effusion. Hepatobiliary: Diffuse hepatic steatosis.  Gallbladder sludge. Pancreas: Unremarkable Spleen: Unremarkable Adrenals/Urinary Tract: Kidneys and adrenal glands are within normal limits. The bladder is distended and partially imaged. Stomach/Bowel: Small hiatal hernia is suspected. No obvious mass in the visualized colon. No evidence of small-bowel obstruction. Lymphatic: No retroperitoneal adenopathy. Other: There is no free fluid. Several surgical staples are seen in the right paraspinal region of the upper lumbar spine of unknown significance. Musculoskeletal: There is a mixed sclerotic and lytic lesion within the right iliac bone. There is some and ostial scalloping and thinning of the overlying cortex. It measures up to 2.2 cm. There is no prior imaging for comparison. IMPRESSION: VASCULAR There is no evidence of renal  artery stenosis and no explanation for uncontrolled hypertension. No acute vascular pathology. NON-VASCULAR Small pericardial effusion. Bladder distention. Postoperative changes in the right paraspinal region. There is a lytic lesion in the right iliac bone. If there is pain associated with this area, consider MRI to further characterize. Electronically Signed   By: Jolaine Click M.D.   On: 10/12/2017 12:11    Cardiac Studies   2d echo 10/11/17 Study Conclusions - Left ventricle: The cavity size was severely dilated. Wall   thickness was increased in a pattern of mild LVH. Systolic   function was severely reduced. The estimated ejection fraction   was in the range of 20% to 25%. Dyskinesis of the   basal-midanteroseptal myocardium. Doppler parameters are   consistent with a reversible restrictive pattern, indicative of   decreased left ventricular diastolic compliance and/or increased   left atrial pressure (grade 3 diastolic dysfunction). - Mitral valve: There was mild regurgitation. - Left atrium: The atrium was moderately dilated. - Pulmonary arteries: Systolic pressure was moderately to severely   increased. PA peak pressure: 64 mm Hg (S). - Pericardium, extracardiac: A trivial pericardial effusion was   identified.  Patient Profile     42 y.o. 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  Assessment & Plan    1. Acute combined CHF with mod-severely elevated PA pressures - admit wt listed as 228 but likely inaccurate - next weight was 210 and is now down to 206 today. I/O's seem incomplete but he is clinically improving. Renal function stable. Will order care management consult, CHF book, dietitian consult for education. R/LHC with patent coronaries, severely dilated/hypocontractile  LV EF 20%, elevated LVEDP . Discussed soft BP with MD (down to 85/45 earlier). Per d/w Dr. Mayford Knife, decrease carvedilol to 3.125mg  BID, hold Imdur/lisinopril (with an eye towards restarting lisinopril tomorrow), continue Lasix, and continue hydralazine with hold parameters in place.  2. HTN with hypotension today - med changes as above. Albumin noted to be slightly low. Check UA for proteinuria.  3. Hypokalemia - K 3.7 today. Was due for this AM but did not receive prior to cath; IM gave . Will give another this evening and continue daily as planned.  4. Several enlarged lymph nodes of uncertain etiology and right iliac lytic lesion on CTs this admission - not clear what these represent. Further attention by primary team.  5. Small pericardial effusion - follow clinically.  For questions or updates, please contact  [facility] Please consult [email or web address] for contact info under Cardiology/STEMI.  Signed, Laurann Montana, PA-C 10/13/2017, 10:21 AM

## 2017-10-13 NOTE — Progress Notes (Signed)
Pt was received form [facility]  Alert and oriented X4, denies any CP or SOB , skin warm and dry at this time.  Pt placed on bedside monitor, IVF fluids infusing and consent signed.

## 2017-10-13 NOTE — Interval H&P Note (Signed)
Cath Lab Visit (complete for each Cath Lab visit)  Clinical Evaluation Leading to the Procedure:   ACS: No.  Non-ACS:    Anginal Classification: CCS III  Anti-ischemic medical therapy: Minimal Therapy (1 class of medications)  Non-Invasive Test Results: No non-invasive testing performed  Prior CABG: No previous CABG      History and Physical Interval Note:  10/13/2017 11:39 AM  John Olsen  has presented today for surgery, with the diagnosis of cp  The various methods of treatment have been discussed with the patient and family. After consideration of risks, benefits and other options for treatment, the patient has consented to  Procedure(s): RIGHT/LEFT HEART CATH AND CORONARY ANGIOGRAPHY (N/A) Ultrasound Guidance For Vascular Access as a surgical intervention .  The patient's history has been reviewed, patient examined, no change in status, stable for surgery.  I have reviewed the patient's chart and labs.  Questions were answered to the patient's satisfaction.     Lyn Records III

## 2017-10-13 NOTE — Progress Notes (Signed)
[facility] BAND REMOVAL  LOCATION:    Right  Radial   DEFLATED PER PROTOCOL:    Yes.    TIME BAND OFF / DRESSING APPLIED:    1510p Covered with gauze and secured with tegaderm   SITE UPON ARRIVAL:    Level 0  SITE AFTER BAND REMOVAL:    Level 0  CIRCULATION SENSATION AND MOVEMENT:    Within Normal Limits   Yes.    COMMENTS:   Pt denies any discomfort at site and is able to move extremties without pain.

## 2017-10-13 NOTE — Progress Notes (Signed)
patient is admitted to 1420 from cath lab. Admission VS is stable and patient is AO X4 with no family at bedside.

## 2017-10-13 NOTE — Plan of Care (Signed)
  Problem: Health Behavior/Discharge Planning: Goal: Ability to manage health-related needs will improve Outcome: Progressing   Problem: Clinical Measurements: Goal: Ability to maintain clinical measurements within normal limits will improve Outcome: Progressing Goal: Will remain free from infection Outcome: Progressing Goal: Diagnostic test results will improve Outcome: Progressing Goal: Respiratory complications will improve Outcome: Progressing Goal: Cardiovascular complication will be avoided Outcome: Progressing   Problem: Clinical Measurements: Goal: Will remain free from infection Outcome: Progressing   Problem: Coping: Goal: Level of anxiety will decrease Outcome: Not Applicable

## 2017-10-13 NOTE — Progress Notes (Signed)
PROGRESS NOTE    John Olsen  ZOX:[id number] DOB: Jun 23, 1975 DOA: 10/11/2017 PCP: Patient, No Pcp Per    Brief Narrative:  42 year old male who presented with dyspnea.  He does have dyspnea past medical history of well-controlled hypertension.  He complained of worsening dyspnea for the last 3 to 4 weeks prior to hospitalization.  Associated with orthopnea, bilateral extremity edema and intermittent nonproductive cough.  On initial physical examination blood pressure 157 113, heart rate 98, temperature 97.6, respiratory 20, oxygen saturation 98%.  Moist mucous membranes, lungs with bilateral rails, heart S1-S2 present, rhythmic, no murmurs, the abdomen was soft nontender, 2+ pitting lower extremity edema.   Patient was admitted to the hospital working diagnosis of acute decompensated heart failure due to uncontrolled hypertension/hypertensive emergency.    Assessment & Plan:   Principal Problem:   Acute combined systolic and diastolic congestive heart failure (HCC) Active Problems:   Hypertensive urgency   DCM (dilated cardiomyopathy) (HCC)   Hypokalemia   1. Decompensated systolic heart failure (acute). Patient clinically more euvolemic, will continue heart failure management with b blockade and ace inh (low dose) follow on diagnostic heart catheterization. Continue telemetry monitoring. Continue diuresis with furosemide. Documented urine output 900 over last 24 hours.    2. Uncontrolled HTN. Improved blood pressure control will continue with lisinopril and carvedilol. Secondary hypertension workup in progress.   3. Hypokalemia. Likely due to aggressive diuresis with loop diuretics, will continue correction with kcl. Preserved renal function with serum cr at 0.91. Change furosemide to 40 mg po daily to keep negative fluid balance.    DVT prophylaxis: enoxaparin   Code Status: full  Family Communication: no family at the bedside Disposition Plan:  [place] when clinically improved     Consultants:   Cardiology   Procedures:   Left and right heart cath   Antimicrobials:       Subjective: Patient is feeling better, dyspnea improved, no chest pain, no nausea or vomiting.   Objective: Vitals:   10/13/17 0500 10/13/17 0600 10/13/17 0638 10/13/17 0702  BP: (!) 142/105 117/79    Pulse: 82 81 79   Resp: 18 11 14    Temp:    98.1 F (36.7 C)  TempSrc:    Oral  SpO2: 97% (!) 88% 93%   Weight:      Height:        Intake/Output Summary (Last 24 hours) at 10/13/2017 0742 Last data filed at 10/13/2017 0600 Gross per 24 hour  Intake 790.33 ml  Output 900 ml  Net -109.67 ml   Filed Weights   10/12/17 0830 10/12/17 2042 10/13/17 0102  Weight: 93.6 kg (206 lb 5.6 oz) 93.8 kg (206 lb 12.7 oz) 93.8 kg (206 lb 12.7 oz)    Examination:   General: Not in pain or dyspnea Neurology: Awake and alert, non focal  E ENT: no pallor, no icterus, oral mucosa moist Cardiovascular: No JVD. S1-S2 present, rhythmic, positive S3 gallop, rubs, or murmurs. Trace non pitting lower extremity edema. Pulmonary: decreased breath sounds bilaterally, adequate air movement, no wheezing, or rhonchi, mild bibasilar rales. Gastrointestinal. Abdomen with no organomegaly, non tender, no rebound or guarding Skin. No rashes Musculoskeletal: no joint deformities     Data Reviewed: I have personally reviewed following labs and imaging studies  CBC: Recent Labs  Lab 10/11/17 0226 10/12/17 0929  WBC 8.8 11.5*  HGB 14.5 16.5  HCT 43.4 49.2  MCV 92.9 92.1  PLT 257 276   Basic Metabolic Panel: Recent Labs  Lab 10/11/17 0226 10/12/17 0929 10/13/17 0323  NA 139 139 140  K 3.7 3.2* 3.7  CL 106 101 105  CO2 22 26 24   GLUCOSE 114* 110* 97  BUN 25* 12 15  CREATININE 1.17 0.98 0.91  CALCIUM 8.3* 8.5* 8.3*   GFR: Estimated Creatinine Clearance: 116.8 mL/min (by C-G formula based on SCr of 0.91 mg/dL). Liver Function Tests: Recent Labs  Lab 10/11/17 0226  AST 31  ALT 43   ALKPHOS 68  BILITOT 1.6*  PROT 5.9*  ALBUMIN 3.3*   No results for input(s): LIPASE, AMYLASE in the last 168 hours. No results for input(s): AMMONIA in the last 168 hours. Coagulation Profile: Recent Labs  Lab 10/13/17 0323  INR 1.10   Cardiac Enzymes: Recent Labs  Lab 10/11/17 0226  TROPONINI <0.03   BNP (last 3 results) No results for input(s): PROBNP in the last 8760 hours. HbA1C: Recent Labs    10/11/17 1139  HGBA1C 5.6   CBG: No results for input(s): GLUCAP in the last 168 hours. Lipid Profile: Recent Labs    10/11/17 0226  CHOL 121  HDL 29*  LDLCALC 78  TRIG 72  CHOLHDL 4.2   Thyroid Function Tests: Recent Labs    10/11/17 1139  TSH 3.370   Anemia Panel: No results for input(s): VITAMINB12, FOLATE, FERRITIN, TIBC, IRON, RETICCTPCT in the last 72 hours.    Radiology Studies: I have reviewed all of the imaging during this hospital visit personally     Scheduled Meds: . carvedilol  25 mg Oral BID WC  . enoxaparin (LOVENOX) injection  40 mg Subcutaneous Q24H  . furosemide  40 mg Intravenous BID  . hydrALAZINE  25 mg Oral Q8H  . isosorbide mononitrate  30 mg Oral Daily  . lisinopril  10 mg Oral Daily  . potassium chloride  40 mEq Oral Daily  . sodium chloride flush  3 mL Intravenous Q12H   Continuous Infusions: . sodium chloride    . sodium chloride 50 mL/hr at 10/13/17 0538     LOS: 2 days        Coralie Keens, MD [facility] Pager [phone]

## 2017-10-14 ENCOUNTER — Encounter (HOSPITAL_COMMUNITY): Payer: Self-pay | Admitting: Interventional Cardiology

## 2017-10-14 LAB — POCT I-STAT 3, ART BLOOD GAS (G3+)
Acid-base deficit: 4 mmol/L — ABNORMAL HIGH (ref 0.0–2.0)
Bicarbonate: 21.5 mmol/L (ref 20.0–28.0)
O2 Saturation: 94 %
PCO2 ART: 39.8 mmHg (ref 32.0–48.0)
PH ART: 7.34 — AB (ref 7.350–7.450)
PO2 ART: 76 mmHg — AB (ref 83.0–108.0)
TCO2: 23 mmol/L (ref 22–32)

## 2017-10-14 LAB — BASIC METABOLIC PANEL
ANION GAP: 9 (ref 5–15)
BUN: 23 mg/dL — ABNORMAL HIGH (ref 6–20)
CO2: 23 mmol/L (ref 22–32)
Calcium: 8.1 mg/dL — ABNORMAL LOW (ref 8.9–10.3)
Chloride: 102 mmol/L (ref 101–111)
Creatinine, Ser: 1.08 mg/dL (ref 0.61–1.24)
Glucose, Bld: 157 mg/dL — ABNORMAL HIGH (ref 65–99)
Potassium: 3.8 mmol/L (ref 3.5–5.1)
Sodium: 134 mmol/L — ABNORMAL LOW (ref 135–145)

## 2017-10-14 MED ORDER — FUROSEMIDE 40 MG PO TABS: 40.0000 mg | ORAL_TABLET | Freq: Every day | ORAL | 0 refills | Status: DC

## 2017-10-14 MED ORDER — SPIRONOLACTONE 25 MG PO TABS: 12.5000 mg | ORAL_TABLET | Freq: Every day | ORAL | 0 refills | Status: DC

## 2017-10-14 MED ORDER — LOSARTAN POTASSIUM 25 MG PO TABS: 25.0000 mg | ORAL_TABLET | Freq: Every day | ORAL | 0 refills | Status: DC

## 2017-10-14 MED ORDER — LOSARTAN POTASSIUM 25 MG PO TABS: 25.0000 mg | ORAL_TABLET | Freq: Every day | ORAL | Status: DC

## 2017-10-14 MED ORDER — POTASSIUM CHLORIDE CRYS ER 10 MEQ PO TBCR: 10.0000 meq | EXTENDED_RELEASE_TABLET | Freq: Every day | ORAL | 0 refills | Status: DC

## 2017-10-14 MED ORDER — LIVING BETTER WITH HEART FAILURE BOOK: 1.0000 | Freq: Once | 0 refills | Status: AC

## 2017-10-14 MED ORDER — CARVEDILOL 3.125 MG PO TABS: 3.1250 mg | ORAL_TABLET | Freq: Two times a day (BID) | ORAL | 0 refills | Status: DC

## 2017-10-14 NOTE — Progress Notes (Signed)
Nutrition Education Note  RD consulted for nutrition education regarding new onset CHF.  RD provided "Low Sodium Nutrition Therapy," "Sodium-Free Flavoring Tips," and "Sodium Content of Foods" handouts from the Academy of [employer] and Dietetics. Reviewed patient's dietary recall. Provided examples on ways to decrease sodium intake in diet. Discouraged intake of processed foods and use of salt shaker. Encouraged fresh fruits and vegetables as well as whole grain sources of carbohydrates to maximize fiber intake.   RD discussed why it is important for patient to adhere to diet recommendations, and emphasized the role of fluids, foods to avoid, and importance of weighing self daily. Teach back method used.  Patient states that he lives with his dad and that they sometimes cook meals but are planning to begin cooking for more meals. Pt aware of high sodium foods and about restaurant food often being high in sodium. Informed pt of the ability to look up the nutrition facts for restaurant items prior to going out to eat. Patient asked about sodium content of drinks. He reports that when he eats meats it is mainly unfried chicken, [place], and fish.   Expect good compliance.  Body mass index is 33.38 kg/m. Pt meets criteria for obesity based on current BMI.  Current diet order is Heart Healthy, patient is consuming approximately 100% of meals at this time. Labs and medications reviewed. No further nutrition interventions warranted at this time. RD contact information provided. If additional nutrition issues arise, please re-consult RD.      Trenton Gammon, MS, RD, LDN, [facility] Inpatient Clinical Dietitian Pager # [phone] After hours/weekend pager # [phone]

## 2017-10-14 NOTE — Progress Notes (Signed)
Progress Note  Patient Name: John Olsen Date of Encounter: 10/14/2017  Primary Cardiologist: Armanda Magic, MD   Subjective   Patient denies any chest pain or shortness of breath this morning.  Cardiac catheterization yesterday showed normal coronary arteries.  Inpatient Medications    Scheduled Meds: . carvedilol  3.125 mg Oral BID WC  . enoxaparin (LOVENOX) injection  40 mg Subcutaneous Q24H  . furosemide  40 mg Oral Daily  . lisinopril  5 mg Oral Daily  . Living Better with Heart Failure Book   Does not apply Once  . potassium chloride  40 mEq Oral Daily  . sodium chloride flush  3 mL Intravenous Q12H  . spironolactone  12.5 mg Oral Daily   Continuous Infusions: . sodium chloride     PRN Meds: sodium chloride, acetaminophen, hydrALAZINE, nitroGLYCERIN, ondansetron (ZOFRAN) IV, sodium chloride flush   Vital Signs    Vitals:   10/13/17 1629 10/13/17 2019 10/14/17 0446 10/14/17 0924  BP: 109/77 112/78 (!) 134/99 132/87  Pulse: 73 84  91  Resp: 18 18 16    Temp: 97.9 F (36.6 C) 97.6 F (36.4 C) 98.2 F (36.8 C)   TempSrc: Oral Oral Oral   SpO2: 93% 99%  96%  Weight: 206 lb 12.8 oz (93.8 kg)     Height: 5\' 6"  (1.676 m)       Intake/Output Summary (Last 24 hours) at 10/14/2017 1102 Last data filed at 10/13/2017 1630 Gross per 24 hour  Intake 740 ml  Output -  Net 740 ml   Filed Weights   10/12/17 2042 10/13/17 0102 10/13/17 1629  Weight: 206 lb 12.7 oz (93.8 kg) 206 lb 12.7 oz (93.8 kg) 206 lb 12.8 oz (93.8 kg)    Telemetry    NSR - Personally Reviewed  ECG    No new EKG to review - Personally Reviewed  Physical Exam   GEN: No acute distress.   Neck: No JVD Cardiac: RRR, no murmurs, rubs, or gallops.  Respiratory: Clear to auscultation bilaterally. GI: Soft, nontender, non-distended  MS: No edema; No deformity. Neuro:  Nonfocal  Psych: Normal affect   Labs    Chemistry Recent Labs  Lab 10/11/17 0226 10/12/17 0929 10/13/17 0323  10/13/17 1637 10/14/17 0443  NA 139 139 140  --  134*  K 3.7 3.2* 3.7  --  3.8  CL 106 101 105  --  102  CO2 22 26 24   --  23  GLUCOSE 114* 110* 97  --  157*  BUN 25* 12 15  --  23*  CREATININE 1.17 0.98 0.91 1.02 1.08  CALCIUM 8.3* 8.5* 8.3*  --  8.1*  PROT 5.9*  --   --   --   --   ALBUMIN 3.3*  --   --   --   --   AST 31  --   --   --   --   ALT 43  --   --   --   --   ALKPHOS 68  --   --   --   --   BILITOT 1.6*  --   --   --   --   GFRNONAA >60 >60 >60 >60 >60  GFRAA >60 >60 >60 >60 >60  ANIONGAP 11 12 11   --  9     Hematology Recent Labs  Lab 10/11/17 0226 10/12/17 0929 10/13/17 1637  WBC 8.8 11.5* 6.8  RBC 4.67 5.34 4.93  HGB 14.5 16.5 15.4  HCT 43.4 49.2 46.4  MCV 92.9 92.1 94.1  MCH 31.0 30.9 31.2  MCHC 33.4 33.5 33.2  RDW 14.1 14.0 14.3  PLT 257 276 285    Cardiac Enzymes Recent Labs  Lab 10/11/17 0226  TROPONINI <0.03    Recent Labs  Lab 10/11/17 0759  TROPIPOC 0.03     BNP Recent Labs  Lab 10/11/17 0226  BNP 1,070.0*     DDimer No results for input(s): DDIMER in the last 168 hours.   Radiology    No results found.  Cardiac Studies   Cardiac Cath 09/2017 Conclusion    Right dominant coronary anatomy.  Widely patent coronary arteries without evidence of obstructive disease.  RV systolic pressure 36 mmHg.  Unable to manipulate the balloon tip catheter into the main pulmonary artery for recordings.  Severely dilated and globally hypocontractile LV with end-diastolic pressure of 19 mmHg.  Findings are compatible with acute on chronic combined systolic and diastolic heart failure.  Estimated ejection fraction 20%.  RECOMMENDATIONS:   Guideline directed therapy for systolic heart failure.   2D echo 10/11/2017 Study Conclusions  - Left ventricle: The cavity size was severely dilated. Wall   thickness was increased in a pattern of mild LVH. Systolic   function was severely reduced. The estimated ejection fraction   was in  the range of 20% to 25%. Dyskinesis of the   basal-midanteroseptal myocardium. Doppler parameters are   consistent with a reversible restrictive pattern, indicative of   decreased left ventricular diastolic compliance and/or increased   left atrial pressure (grade 3 diastolic dysfunction). - Mitral valve: There was mild regurgitation. - Left atrium: The atrium was moderately dilated. - Pulmonary arteries: Systolic pressure was moderately to severely   increased. PA peak pressure: 64 mm Hg (S). - Pericardium, extracardiac: A trivial pericardial effusion was   identified.  Patient Profile     42 y.o. male with HTN (recent inability to obtain meds after moving back to [place]) and obesity admitted with worsening DOE, found to have recently new LBBB and acute combined CHF (EF 20-25%, grade 3 DD), moderate-severely elevated pulmonary HTN, mild MR.   Assessment & Plan    1. Acute combined CHF with mod-severely elevated PA pressures  - R/LHC with patent coronaries, severely dilated/hypocontractile LV EF 20%, elevated LVEDP .  -I's and O's are incomplete today -Weight remains at 206 pounds which is down 4 pounds from admission  -Lungs are clear and he has no lower extremity edema -I think he appears euvolemic on exam -Continue carvedilol 3.125 mg twice daily, Lasix 40 mg daily and spironolactone 12.5 mg daily -Unfortunately despite my note yesterday stating no more ACE inhibitor and start Entresto and 36 hours after last dose of lisinopril, he was started back on lisinopril 5 mg daily -I will stop lisinopril and start losartan 25 mg daily and when he is seen back in clinic in a week we will switch him to Entresto 24-26 mg twice daily  2. HTN  -BP is well controlled on current medical regimen -Continue carvedilol 3.125 mg twice daily and change lisinopril to losartan 25 mg daily  3. Hypokalemia - replete  4. Several enlarged lymph nodes of uncertain etiology and right iliac lytic  lesion on CTs this admission - not clear what these represent. Further attention by primary team.  5. Small pericardial effusion - follow clinically.  No other recs at this time.  Patient is stable for discharge home.  Again will change him from lisinopril  to losartan 25 mg daily to make an easier change to [facility] as an outpatient.  He will continue on Lasix 40 mg daily and carvedilol 3.125 mg daily along with Spironolactone 12.5 mg daily.  We will arrange TOC follow-up in our clinic in 1 week.  He will need a 2D echocardiogram repeated in 2 months after being on guideline-based medical therapy for heart failure.  For questions or updates, please contact [facility] Please consult [email or web address] for contact info under Cardiology/STEMI.      Signed, Armanda Magic, MD  10/14/2017, 11:02 AM

## 2017-10-14 NOTE — Discharge Summary (Signed)
Physician Discharge Summary  John Olsen ZOX:[id number] DOB: 1975/07/02 DOA: 10/11/2017  PCP: Patient, No Pcp Per  Admit date: 10/11/2017 Discharge date: 10/14/2017  Admitted From: Home Disposition:  Home  Recommendations for Outpatient Follow-up and new medication changes:  1. Follow up with PCP in 1-week 2. Patient has been placed on Losartan, Carvedilol, and Spironolactone for heart failure regimen. 3. Diuresis with furosemide, plus 10 meq Kcl daily, will need close follow up of renal function and electrolytes.   Home Health: home  Equipment/Devices:   Discharge Condition: Stable CODE STATUS: full  Diet recommendation: Heart healthy.   Brief/Interim Summary: 42 year old male who presented with dyspnea.  He does have the significant past medical history of well-controlled hypertension.  He complained of worsening dyspnea for the last 3 to 4 weeks prior to hospitalization, associated with orthopnea, bilateral lower extremity edema and intermittent nonproductive cough.  On initial physical examination blood pressure 157/ 113 mmHg, heart rate 98, temperature 97.6, respiratory 20, oxygen saturation 98%.  Moist mucous membranes, lungs with bilateral rales, heart S1-S2 present, rhythmic, no murmurs, the abdomen was soft nontender, 2+ pitting lower extremity edema.  Sodium 139, potassium 3.7, chloride 1 6, bicarb 22, glucose 114, BUN 25, creatinine 1.17, BNP 1070, white count 8.8, hemoglobin 14.5, hematocrit 43.4, platelets 257, urine drug screen negative.  Chest x-ray with cardiomegaly and bilateral interstitial infiltrates, CT chest with bilateral groundglass opacities, EKG with left axis deviation and a left bundle branch block.   Patient was admitted to the hospital with the working diagnosis of acute decompensated heart failure due to uncontrolled hypertension/hypertensive emergency.   1.  Acute decompensated systolic heart failure/nonischemic cardiomyopathy ejection fraction 20 to 25% in  the left ventricle (present on admission).  Patient was admitted to the medical ward, he was placed on a remote telemetry monitor, he received aggressive diuresis with IV furosemide, negative fluid balance was achieved, with close to 4 kg weight loss since admission.  Further work-up with echocardiography showed ejection fraction of 20 to 25% on the left ventricle, dyskinesis of the basal and mid anterior septal myocardium.  Systolic PA pressure 64.  Small pericardial effusion.  Right ventricle normal systolic function.  Cardiac catheterization showed patent coronaries with no evidence of obstructive disease.  RV systolic pressure was 36 mmHg, unable to get wedge pressure, enddiastolic pressure on the left ventricle 19 mmHg, estimated ejection fraction 20%.   Patient was placed on heart failure regimen including losartan, carvedilol, Aldactone and continue diuresis with furosemide.  Patient will follow-up in the outpatient cardiology clinic.  Will need follow-up echocardiography in 2 to 3 months.  2.  Acute cardiogenic pulmonary edema, present on admission.  Patient was aggressively diuresed with furosemide with improvement of his symptoms.  Discharge oximetry saturation 96% on room air.   3.  Uncontrolled hypertension.  Suspected to be the cause of the cardiomyopathy, patient responded well to medical therapy with losartan, carvedilol and Aldactone.  4.  Hypokalemia.  Potassium was corrected with potassium chloride.  Patient will continue furosemide at discharge.  Potassium supplements 10 mEq daily.  Note that patient is been discharged on spironolactone and losartan.      Discharge Diagnoses:  Principal Problem:   Acute combined systolic and diastolic congestive heart failure (HCC) Active Problems:   Hypertensive urgency   LBBB (left bundle branch block)   DCM (dilated cardiomyopathy) (HCC)   Hypokalemia    Discharge Instructions   Allergies as of 10/14/2017      Reactions   Penicillins  Medication List    TAKE these medications   carvedilol 3.125 MG tablet Commonly known as:  COREG Take 1 tablet (3.125 mg total) by mouth 2 (two) times daily with a meal.   furosemide 40 MG tablet Commonly known as:  LASIX Take 1 tablet (40 mg total) by mouth daily. Start taking on:  10/15/2017   Living Better with Heart Failure Book Misc 1 each by Does not apply route once for 1 dose.   losartan 25 MG tablet Commonly known as:  COZAAR Take 1 tablet (25 mg total) by mouth daily. Start taking on:  10/15/2017   potassium chloride 10 MEQ tablet Commonly known as:  K-DUR,KLOR-CON Take 1 tablet (10 mEq total) by mouth daily. Start taking on:  10/15/2017   spironolactone 25 MG tablet Commonly known as:  ALDACTONE Take 0.5 tablets (12.5 mg total) by mouth daily. Start taking on:  10/15/2017       Allergies  Allergen Reactions  . Penicillins     Consultations:  Cardiology    Procedures/Studies: Dg Chest 2 View  Result Date: 10/11/2017 CLINICAL DATA:  Acute onset of shortness of breath and difficulty sleeping. EXAM: CHEST - 2 VIEW COMPARISON:  None. FINDINGS: The lungs are well-aerated. Vascular congestion is noted. Increased interstitial markings raise concern for mild interstitial edema. There is no evidence of pleural effusion or pneumothorax. The heart is enlarged.  No acute osseous abnormalities are seen. IMPRESSION: Vascular congestion and cardiomegaly. Increased interstitial markings raise concern for mild interstitial edema. Electronically Signed   By: Roanna Raider M.D.   On: 10/11/2017 02:58   Ct Angio Chest Pe W Or Wo Contrast  Result Date: 10/11/2017 CLINICAL DATA:  Shortness of breath EXAM: CT ANGIOGRAPHY CHEST WITH CONTRAST TECHNIQUE: Multidetector CT imaging of the chest was performed using the standard protocol during bolus administration of intravenous contrast. Multiplanar CT image reconstructions and MIPs were obtained to evaluate the vascular anatomy.  CONTRAST:  ISOVUE-370 IOPAMIDOL (ISOVUE-370) INJECTION 76% COMPARISON:  Chest radiograph October 11, 2017 FINDINGS: Cardiovascular: There is no demonstrable pulmonary embolus. There is no thoracic aortic aneurysm. No dissection is seen. It must be noted that the contrast bolus in the aorta is not sufficient to entirely exclude dissection as a potential radiographic differential consideration. The great vessels appear unremarkable. There is cardiomegaly with predominantly left-sided cardiac enlargement. There is a fairly small pericardial effusion present. The main pulmonary outflow tract measures 3.3 cm in diameter, abnormally prominent. Mediastinum/Nodes: Visualized thyroid appears normal. There are multiple mildly prominent lymph nodes throughout the mediastinum. In the aortopulmonary window region, largest lymph node measures 1.8 x 1.2 cm. In the right paratracheal region, largest lymph node measures 1.6 x 1.3 cm. In the right hilar region, largest lymph node measures 1.4 x 1.4 cm. In the subcarinal region on the right, there is a lymph node measuring 1.6 x 1.4 cm. No esophageal lesions are evident. Lungs/Pleura: There is interstitial edema, most notably in the lower lobes. There is patchy atelectatic change in the right upper lobe. A small amount of effusion is tracking along the major fissure on the right. No airspace consolidation is evident. Upper Abdomen: In the visualized upper abdomen, there is contrast reflux into the inferior vena cava and hepatic veins. Visualized upper abdominal structures otherwise appear unremarkable. Musculoskeletal: There are no blastic or lytic bone lesions. No evident chest wall lesion. Review of the MIP images confirms the above findings. IMPRESSION: 1.  No demonstrable pulmonary embolus. 2. Cardiomegaly with primarily left-sided cardiac  enlargement. There is interstitial edema with a small right pleural effusion. These findings are felt to be indicative of a degree of  underlying congestive heart failure. 3.  Small pericardial effusion. 4. Increase in main pulmonary outflow tract size, indicative of a degree of underlying pulmonary arterial hypertension. 5. Reflux of contrast into the inferior vena cava and hepatic veins, a finding likely indicative of increase in right heart pressure. 6.  No airspace consolidation. 7. Several enlarged lymph nodes of uncertain etiology. These lymph nodes potentially may have reactive etiology. 8. No appreciable thoracic aortic aneurysm. No dissection seen. Note that the contrast bolus in the aorta is not sufficient for exclusion of dissection from a radiographic standpoint. Electronically Signed   By: Bretta Bang III M.D.   On: 10/11/2017 11:04   Ct Angio Abdomen W &/or Wo Contrast  Result Date: 10/12/2017 CLINICAL DATA:  Uncontrolled hypertension EXAM: CT ANGIOGRAPHY ABDOMEN TECHNIQUE: Multidetector CT imaging of the abdomen was performed using the standard protocol during bolus administration of intravenous contrast. Multiplanar reconstructed images and MIPs were obtained and reviewed to evaluate the vascular anatomy. CONTRAST:  ISOVUE-370 IOPAMIDOL (ISOVUE-370) INJECTION 76% COMPARISON:  None. FINDINGS: VASCULAR Aorta: Nonaneurysmal and patent. Celiac: Patent.  Branch vessels patent. SMA: Patent branch vessels grossly patent. Renals: 3 right renal arteries and a single left renal artery are patent. There is no evidence for fibromuscular dysplasia. IMA: Patent Inflow: Common iliac arteries are patent and tortuous. Veins: No obvious DVT. Review of the MIP images confirms the above findings. NON-VASCULAR Lower chest: Dependent atelectasis.  Small pericardial effusion. Hepatobiliary: Diffuse hepatic steatosis.  Gallbladder sludge. Pancreas: Unremarkable Spleen: Unremarkable Adrenals/Urinary Tract: Kidneys and adrenal glands are within normal limits. The bladder is distended and partially imaged. Stomach/Bowel: Small hiatal hernia is  suspected. No obvious mass in the visualized colon. No evidence of small-bowel obstruction. Lymphatic: No retroperitoneal adenopathy. Other: There is no free fluid. Several surgical staples are seen in the right paraspinal region of the upper lumbar spine of unknown significance. Musculoskeletal: There is a mixed sclerotic and lytic lesion within the right iliac bone. There is some and ostial scalloping and thinning of the overlying cortex. It measures up to 2.2 cm. There is no prior imaging for comparison. IMPRESSION: VASCULAR There is no evidence of renal artery stenosis and no explanation for uncontrolled hypertension. No acute vascular pathology. NON-VASCULAR Small pericardial effusion. Bladder distention. Postoperative changes in the right paraspinal region. There is a lytic lesion in the right iliac bone. If there is pain associated with this area, consider MRI to further characterize. Electronically Signed   By: Jolaine Click M.D.   On: 10/12/2017 12:11       Subjective: Patient is feeling better, no further dyspnea, no chest pain, or orthopnea., no nausea or vomiting.   Discharge Exam: Vitals:   10/14/17 0446 10/14/17 0924  BP: (!) 134/99 132/87  Pulse:  91  Resp: 16   Temp: 98.2 F (36.8 C)   SpO2:  96%   Vitals:   10/13/17 1629 10/13/17 2019 10/14/17 0446 10/14/17 0924  BP: 109/77 112/78 (!) 134/99 132/87  Pulse: 73 84  91  Resp: 18 18 16    Temp: 97.9 F (36.6 C) 97.6 F (36.4 C) 98.2 F (36.8 C)   TempSrc: Oral Oral Oral   SpO2: 93% 99%  96%  Weight: 93.8 kg (206 lb 12.8 oz)     Height: 5\' 6"  (1.676 m)       General: Not in pain or  dyspnea, Neurology: Awake and alert, non focal  E ENT: mild pallor, no icterus, oral mucosa moist Cardiovascular: No JVD. S1-S2 present, rhythmic, no gallops, rubs, or murmurs. Trace lower extremity edema. Pulmonary: vesicular breath sounds bilaterally, adequate air movement, no wheezing, rhonchi or rales. Gastrointestinal. Abdomen flat, no  organomegaly, non tender, no rebound or guarding Skin. No rashes Musculoskeletal: no joint deformities   The results of significant diagnostics from this hospitalization (including imaging, microbiology, ancillary and laboratory) are listed below for reference.     Microbiology: Recent Results (from the past 240 hour(s))  MRSA PCR Screening     Status: None   Collection Time: 10/11/17  8:39 PM  Result Value Ref Range Status   MRSA by PCR NEGATIVE NEGATIVE Final    Comment:        The GeneXpert MRSA Assay (FDA approved for NASAL specimens only), is one component of a comprehensive MRSA colonization surveillance program. It is not intended to diagnose MRSA infection nor to guide or monitor treatment for MRSA infections. Performed at [facility], [address], [place], [place] [address]      Labs: BNP (last 3 results) Recent Labs    10/11/17 0226  BNP 1,070.0*   Basic Metabolic Panel: Recent Labs  Lab 10/11/17 0226 10/12/17 0929 10/13/17 0323 10/13/17 1637 10/14/17 0443  NA 139 139 140  --  134*  K 3.7 3.2* 3.7  --  3.8  CL 106 101 105  --  102  CO2 22 26 24   --  23  GLUCOSE 114* 110* 97  --  157*  BUN 25* 12 15  --  23*  CREATININE 1.17 0.98 0.91 1.02 1.08  CALCIUM 8.3* 8.5* 8.3*  --  8.1*   Liver Function Tests: Recent Labs  Lab 10/11/17 0226  AST 31  ALT 43  ALKPHOS 68  BILITOT 1.6*  PROT 5.9*  ALBUMIN 3.3*   No results for input(s): LIPASE, AMYLASE in the last 168 hours. No results for input(s): AMMONIA in the last 168 hours. CBC: Recent Labs  Lab 10/11/17 0226 10/12/17 0929 10/13/17 1637  WBC 8.8 11.5* 6.8  HGB 14.5 16.5 15.4  HCT 43.4 49.2 46.4  MCV 92.9 92.1 94.1  PLT 257 276 285   Cardiac Enzymes: Recent Labs  Lab 10/11/17 0226  TROPONINI <0.03   BNP: Invalid input(s): POCBNP CBG: No results for input(s): GLUCAP in the last 168 hours. D-Dimer No results for input(s): DDIMER in the last 72 hours. Hgb  A1c No results for input(s): HGBA1C in the last 72 hours. Lipid Profile No results for input(s): CHOL, HDL, LDLCALC, TRIG, CHOLHDL, LDLDIRECT in the last 72 hours. Thyroid function studies No results for input(s): TSH, T4TOTAL, T3FREE, THYROIDAB in the last 72 hours.  Invalid input(s): FREET3 Anemia work up No results for input(s): VITAMINB12, FOLATE, FERRITIN, TIBC, IRON, RETICCTPCT in the last 72 hours. Urinalysis No results found for: COLORURINE, APPEARANCEUR, LABSPEC, PHURINE, GLUCOSEU, HGBUR, BILIRUBINUR, KETONESUR, PROTEINUR, UROBILINOGEN, NITRITE, LEUKOCYTESUR Sepsis Labs Invalid input(s): PROCALCITONIN,  WBC,  LACTICIDVEN Microbiology Recent Results (from the past 240 hour(s))  MRSA PCR Screening     Status: None   Collection Time: 10/11/17  8:39 PM  Result Value Ref Range Status   MRSA by PCR NEGATIVE NEGATIVE Final    Comment:        The GeneXpert MRSA Assay (FDA approved for NASAL specimens only), is one component of a comprehensive MRSA colonization surveillance program. It is not intended to diagnose MRSA  infection nor to guide or monitor treatment for MRSA infections. Performed at [facility], [address], [place], [place] [address]      Time coordinating discharge: 45 minutes  SIGNED:   Coralie Keens, MD  [facility] 10/14/2017, 1:43 PM Pager [phone]  If 7PM-7AM, please contact night-coverage [email or web address] Password [email or web address]

## 2017-10-14 NOTE — Progress Notes (Signed)
PROGRESS NOTE    John Olsen  EFE:[id number] DOB: [birth year] DOA: 10/11/2017 PCP: Patient, No Pcp Per    Brief Narrative:  John Olsen is a 42 year old male who presented to the ER with worsening dyspnea for the past 3-4 weeks. Past medical history significant for uncontrolled hypertension. Dyspnea is worsened with exertion, also experiencing nonproductive cough, orthopnea, bilateral lower extremity edema. Denies chest pain or palpitations. Denies any history of heart disease, not currently on any medications. On admission, blood pressure 157/113 mmHg, pulse 98, temperature 97.6, respirations 20, oxygen saturation 98% on room air. Sodium 139, potassium 3.2, chloride 101, CO2 26, glucose 110, BUN 12, creatinine 0.98, calcium 8.5, anion gap 12, BNP 1.070, WBC 11.5, RBC 5.34, A1c 5.6. CXR showed vascular congestion and cardiomegaly with concern for mild interstitial edema. EKG showed sinus tachycardia with LBBB. Echocardiogram showed severely reduced LV systolic function at 20-25%.  Patient was admitted to the hospital with working diagnosis of acute decompensated heart failure due to uncontrolled hypertension/hypertensive urgency.   Assessment & Plan:   Principal Problem:   Acute combined systolic and diastolic congestive heart failure (HCC) Active Problems:   Hypertensive urgency   LBBB (left bundle branch block)   DCM (dilated cardiomyopathy) (HCC)   Hypokalemia   Decompensated systolic heart failure - improved -Patient reports feeling much better today and back to his baseline. -Weight down 4 pounds since admission, I&O's were not monitored yesterday. -Cath showed patent coronary arteries.  -As per cardiology patient is stable for discharge. -Continue Carvedilol 3.126 mg twice daily, Lasix 40 mg daily, spironolactone 12.5 mg daily.  -Discontinue lisinopril, start Losartan 25 mg daily, follow up with cardiology in the clinic in one week and start on Entresto 24-26 mg twice  daily.  Uncontrolled hypertension - improved  -Controlled, last measured at 132/87 mmHg. -Continue Carvedilol 3.125 mg twice daily, as per cardiology change lisinopril to losartan 25 mg daily.  Hypokalemia - improved -Improved, potassium today 3.8. Creatinine 1.08.    DVT prophylaxis: Lovenox. Code Status: FULL. Family Communication: None at bedside. Disposition Plan: Home when improved.   Consultants:   Cardiology  Procedures:  Left and right heart catheterization 4/24  Antimicrobials:   None   Subjective: Patient denies chest pain, shortness of breath, cough, nausea, vomiting, diarrhea. Denies abdominal pain, dysuria. Reports having good urine output, clear and yellow in color. Ambulating around the room and to the bathroom without assistance.   Objective: Vitals:   10/13/17 1629 10/13/17 2019 10/14/17 0446 10/14/17 0924  BP: 109/77 112/78 (!) 134/99 132/87  Pulse: 73 84  91  Resp: 18 18 16    Temp: 97.9 F (36.6 C) 97.6 F (36.4 C) 98.2 F (36.8 C)   TempSrc: Oral Oral Oral   SpO2: 93% 99%  96%  Weight: 93.8 kg (206 lb 12.8 oz)     Height: 5\' 6"  (1.676 m)       Intake/Output Summary (Last 24 hours) at 10/14/2017 1204 Last data filed at 10/13/2017 1630 Gross per 24 hour  Intake 740 ml  Output -  Net 740 ml   Filed Weights   10/12/17 2042 10/13/17 0102 10/13/17 1629  Weight: 93.8 kg (206 lb 12.7 oz) 93.8 kg (206 lb 12.7 oz) 93.8 kg (206 lb 12.8 oz)    Examination:  General exam: Appears calm and comfortable  Respiratory system: +mild wheeze bilaterally. Respiratory effort normal. Cardiovascular system: S1 & S2 heard, RRR. No murmurs, rubs or clicks. No pedal edema. Gastrointestinal system: Abdomen is  nondistended, soft and nontender. Normal bowel sounds heard. Central nervous system: Alert and oriented. No focal neurological deficits. Extremities: Moves all four extremities, ambulatory. Skin: No rashes, lesions or ulcers. +hyperpigmentation of  bilateral lower extremities. Psychiatry: Judgement and insight appear normal. Mood & affect appropriate.    Data Reviewed: I have personally reviewed following labs and imaging studies  CBC: Recent Labs  Lab 10/11/17 0226 10/12/17 0929 10/13/17 1637  WBC 8.8 11.5* 6.8  HGB 14.5 16.5 15.4  HCT 43.4 49.2 46.4  MCV 92.9 92.1 94.1  PLT 257 276 285   Basic Metabolic Panel: Recent Labs  Lab 10/11/17 0226 10/12/17 0929 10/13/17 0323 10/13/17 1637 10/14/17 0443  NA 139 139 140  --  134*  K 3.7 3.2* 3.7  --  3.8  CL 106 101 105  --  102  CO2 22 26 24   --  23  GLUCOSE 114* 110* 97  --  157*  BUN 25* 12 15  --  23*  CREATININE 1.17 0.98 0.91 1.02 1.08  CALCIUM 8.3* 8.5* 8.3*  --  8.1*   GFR: Estimated Creatinine Clearance: 98.5 mL/min (by C-G formula based on SCr of 1.08 mg/dL). Liver Function Tests: Recent Labs  Lab 10/11/17 0226  AST 31  ALT 43  ALKPHOS 68  BILITOT 1.6*  PROT 5.9*  ALBUMIN 3.3*   No results for input(s): LIPASE, AMYLASE in the last 168 hours. No results for input(s): AMMONIA in the last 168 hours. Coagulation Profile: Recent Labs  Lab 10/13/17 0323  INR 1.10   Cardiac Enzymes: Recent Labs  Lab 10/11/17 0226  TROPONINI <0.03   BNP (last 3 results) No results for input(s): PROBNP in the last 8760 hours. HbA1C: No results for input(s): HGBA1C in the last 72 hours. CBG: No results for input(s): GLUCAP in the last 168 hours. Lipid Profile: No results for input(s): CHOL, HDL, LDLCALC, TRIG, CHOLHDL, LDLDIRECT in the last 72 hours. Thyroid Function Tests: No results for input(s): TSH, T4TOTAL, FREET4, T3FREE, THYROIDAB in the last 72 hours. Anemia Panel: No results for input(s): VITAMINB12, FOLATE, FERRITIN, TIBC, IRON, RETICCTPCT in the last 72 hours. Sepsis Labs: No results for input(s): PROCALCITON, LATICACIDVEN in the last 168 hours.  Recent Results (from the past 240 hour(s))  MRSA PCR Screening     Status: None   Collection Time:  10/11/17  8:39 PM  Result Value Ref Range Status   MRSA by PCR NEGATIVE NEGATIVE Final    Comment:        The GeneXpert MRSA Assay (FDA approved for NASAL specimens only), is one component of a comprehensive MRSA colonization surveillance program. It is not intended to diagnose MRSA infection nor to guide or monitor treatment for MRSA infections. Performed at [facility], [address] [place], [place] [address]      Radiology Studies: No results found.   Scheduled Meds: . carvedilol  3.125 mg Oral BID WC  . enoxaparin (LOVENOX) injection  40 mg Subcutaneous Q24H  . furosemide  40 mg Oral Daily  . Living Better with Heart Failure Book   Does not apply Once  . [START ON 10/15/2017] losartan  25 mg Oral Daily  . potassium chloride  40 mEq Oral Daily  . sodium chloride flush  3 mL Intravenous Q12H  . spironolactone  12.5 mg Oral Daily   Continuous Infusions: . sodium chloride       LOS: 3 days    Time spent: 25 minutes.  Loney Loh, PA-S [facility] Pager [phone]  If 7PM-7AM, please contact night-coverage [email or web address] Password [facility] 10/14/2017, 12:04 PM

## 2017-10-15 MED FILL — Heparin Sod (Porcine)-NaCl IV Soln 1000 Unit/500ML-0.9%: INTRAVENOUS | Qty: 1000 | Status: AC

## 2017-10-25 ENCOUNTER — Encounter: Payer: Self-pay | Admitting: Physician Assistant

## 2017-10-25 NOTE — Progress Notes (Signed)
Cardiology Office Note    Date:  10/26/2017  ID:  [name], DOB July 02, 1975, MRN [id number] PCP:  Patient, No Pcp Per  Cardiologist:  Dr. Mayford Knife   Chief Complaint: f/u CHF  History of Present Illness:  John Olsen is a 42 y.o. male with history of HTN (recent inability to obtain meds after moving back to [facility]), obesity, recently diagnosed new LBBB and acute combined CHF/NICM (EF 20-25%, grade 3 DD), moderate-severely elevated pulmonary pressures, mild MR, enlarged lymph nodes on chest CT and R iliac lytic lesion here for f/u CHF. He was recently admitted 09/2017 with CHF. 2D Echo 10/11/17 showed mild LVH, EF 20-25%, grade 3 DD, mild MR, mod LAE, mod-severe PASP, trivial pericardial effusion. R/LHC with patent coronaries, severely dilated/hypocontractile LV EF 20%, elevated LVEDP . Guideline directed meds were initiated but scaled back closer to DC due to softer BP after diuresis. Dr. Mayford Knife recommended to transition to [facility] as OP if stable and f/u echo 2 months. Pertinent labs include glucose 157, Na 134, BUN 23, Cr 1.08, CBC wnl, UDS neg, A1C 5.6, TSH wnl, LDL 78.  He returns for follow-up today feeling profoundly better. No more orthopnea or SOB. No CP, LEE, PND. Weight stable at home. He is limiting sodium and fluid intake. He did not have any issues obtaining his medications. He is unemployed and has Medicaid. Blood pressure was initially 132/88 but he told tech waiting room was somewhat stressful - f/u BP 124/78. No fam hx of CHF.  Past Medical History:  Diagnosis Date  . Chronic combined systolic and diastolic CHF (congestive heart failure) (HCC)   . Hypertension   . LBBB (left bundle branch block)   . NICM (nonischemic cardiomyopathy) (HCC)   . Pericardial effusion    a. trivial by echo 09/2017.    Past Surgical History:  Procedure Laterality Date  . NEUROBLASTOMA EXCISION     Had prior to 42 year of age, abdominal  . RIGHT/LEFT HEART CATH AND CORONARY ANGIOGRAPHY  N/A 10/13/2017   Procedure: RIGHT/LEFT HEART CATH AND CORONARY ANGIOGRAPHY;  Surgeon: Lyn Records, MD;  Location: [facility] INVASIVE CV LAB;  Service: Cardiovascular;  Laterality: N/A;  . ULTRASOUND GUIDANCE FOR VASCULAR ACCESS  10/13/2017   Procedure: Ultrasound Guidance For Vascular Access;  Surgeon: Lyn Records, MD;  Location: [facility] INVASIVE CV LAB;  Service: Cardiovascular;;    Current Medications: Current Meds  Medication Sig  . carvedilol (COREG) 3.125 MG tablet Take 1 tablet (3.125 mg total) by mouth 2 (two) times daily with a meal.  . furosemide (LASIX) 40 MG tablet Take 1 tablet (40 mg total) by mouth daily.  Marland Kitchen losartan (COZAAR) 25 MG tablet Take 1 tablet (25 mg total) by mouth daily.  . potassium chloride SA (K-DUR,KLOR-CON) 10 MEQ tablet Take 1 tablet (10 mEq total) by mouth daily.  Marland Kitchen spironolactone (ALDACTONE) 25 MG tablet Take 0.5 tablets (12.5 mg total) by mouth daily.    Allergies:   Penicillins   Social History   Socioeconomic History  . Marital status: Single    Spouse name: Not on file  . Number of children: Not on file  . Years of education: Not on file  . Highest education level: Not on file  Occupational History  . Occupation: Unemployed  Social Needs  . Financial resource strain: Not on file  . Food insecurity:    Worry: Not on file    Inability: Not on file  . Transportation needs:    Medical: Not  on file    Non-medical: Not on file  Tobacco Use  . Smoking status: Never Smoker  . Smokeless tobacco: Never Used  Substance and Sexual Activity  . Alcohol use: Never    Frequency: Never  . Drug use: Never  . Sexual activity: Not on file  Lifestyle  . Physical activity:    Days per week: Not on file    Minutes per session: Not on file  . Stress: Not on file  Relationships  . Social connections:    Talks on phone: Not on file    Gets together: Not on file    Attends religious service: Not on file    Active member of club or organization: Not on file     Attends meetings of clubs or organizations: Not on file    Relationship status: Not on file  Other Topics Concern  . Not on file  Social History Narrative   Moved back to [place] to live with his parents     Family History:  Family History  Problem Relation Age of Onset  . Cancer Mother   . Coronary artery disease Father        had a stent approx 2015    ROS:   Please see the history of present illness. All other systems are reviewed and otherwise negative.    PHYSICAL EXAM:   VS:  BP 124/78 (BP Location: Left Arm)   Pulse 94   Ht 5\' 6"  (1.676 m)   Wt 204 lb (92.5 kg)   SpO2 98%   BMI 32.93 kg/m   BMI: Body mass index is 32.93 kg/m. GEN: Well nourished, well developed obese WM, in no acute distress  HEENT: normocephalic, atraumatic Neck: no JVD, carotid bruits, or masses Cardiac: RRR; no murmurs, rubs, or gallops, no edema  Respiratory:  clear to auscultation bilaterally, normal work of breathing GI: soft, nontender, nondistended, + BS MS: no deformity or atrophy  Skin: warm and dry, no rash. Right radial cath site without hematoma or ecchymosis; good pulse. Neuro:  Alert and Oriented x 3, Strength and sensation are intact, follows commands Psych: euthymic mood, full affect  Wt Readings from Last 3 Encounters:  10/26/17 204 lb (92.5 kg)  10/13/17 206 lb 12.8 oz (93.8 kg)  10/02/17 200 lb (90.7 kg)      Studies/Labs Reviewed:    EKG: EKG was not ordered today  Recent Labs: 10/11/2017: ALT 43; B Natriuretic Peptide 1,070.0; TSH 3.370 10/13/2017: Hemoglobin 15.4; Platelets 285 10/14/2017: BUN 23; Creatinine, Ser 1.08; Potassium 3.8; Sodium 134   Lipid Panel    Component Value Date/Time   CHOL 121 10/11/2017 0226   TRIG 72 10/11/2017 0226   HDL 29 (L) 10/11/2017 0226   CHOLHDL 4.2 10/11/2017 0226   VLDL 14 10/11/2017 0226   LDLCALC 78 10/11/2017 0226    Additional studies/ records that were reviewed today include: Summarized above.   ASSESSMENT &  PLAN:   1. NICM/chronic combined CHF with elevated pulmonary pressures - he appears compensated at present time. Reviewed 2g sodium restriction, 2L fluid restriction, daily weights with patient. Continue current regimen today but check BMET today to decide on further med titration (consideration of transition to [facility] versus titration of carvedilol). Given his young age, NICM, unemployed status and Medicaid with prior issues obtaining medications, I feel he would best be served by the advanced heart failure clinic given their resources. He is agreeable to this plan. Appreciate their input on timing of repeat echocardiogram.  He may need further evaluation of his NICM such as cMRI as the etiology of his HF is not entirely clear. 2. HTN - BP mildly elevated today - follow with anticipated med changes. 3. LBBB - if LV dysfunction persists on future echocardiograms, may need referral to EP to consider CRT-D. 4. Trivial pericardial effusion - asymptomatic. Follow clinically. 5. Abnormal finding on imaging - reviewed chest CT lymphadenopathy and R iliac lesion with pt - advised to f/u PCP for further evaluation.  Disposition: F/u with advanced CHF team.   Medication Adjustments/Labs and Tests Ordered: Current medicines are reviewed at length with the patient today.  Concerns regarding medicines are outlined above. Medication changes, Labs and Tests ordered today are summarized above and listed in the Patient Instructions accessible in Encounters.   Signed, Laurann Montana, PA-C  10/26/2017 10:50 AM    [facility] [address] [place], [place], [place]  [address] Phone: [phone]; Fax: [phone]

## 2017-10-26 ENCOUNTER — Ambulatory Visit (INDEPENDENT_AMBULATORY_CARE_PROVIDER_SITE_OTHER): Payer: Medicaid Other | Admitting: Physician Assistant

## 2017-10-26 ENCOUNTER — Encounter: Payer: Self-pay | Admitting: Physician Assistant

## 2017-10-26 ENCOUNTER — Telehealth: Payer: Self-pay

## 2017-10-26 VITALS — BP 124/78 | HR 94 | Ht 66.0 in | Wt 204.0 lb

## 2017-10-26 DIAGNOSIS — I5042 Chronic combined systolic (congestive) and diastolic (congestive) heart failure: Secondary | ICD-10-CM | POA: Diagnosis not present

## 2017-10-26 DIAGNOSIS — I313 Pericardial effusion (noninflammatory): Secondary | ICD-10-CM

## 2017-10-26 DIAGNOSIS — I447 Left bundle-branch block, unspecified: Secondary | ICD-10-CM | POA: Diagnosis not present

## 2017-10-26 DIAGNOSIS — I428 Other cardiomyopathies: Secondary | ICD-10-CM | POA: Diagnosis not present

## 2017-10-26 DIAGNOSIS — I10 Essential (primary) hypertension: Secondary | ICD-10-CM | POA: Diagnosis not present

## 2017-10-26 DIAGNOSIS — R9389 Abnormal findings on diagnostic imaging of other specified body structures: Secondary | ICD-10-CM | POA: Diagnosis not present

## 2017-10-26 DIAGNOSIS — I3139 Other pericardial effusion (noninflammatory): Secondary | ICD-10-CM

## 2017-10-26 LAB — BASIC METABOLIC PANEL
BUN / CREAT RATIO: 20 (ref 9–20)
BUN: 20 mg/dL (ref 6–20)
CO2: 23 mmol/L (ref 20–29)
CREATININE: 0.98 mg/dL (ref 0.76–1.27)
Calcium: 9.1 mg/dL (ref 8.7–10.2)
Chloride: 100 mmol/L (ref 96–106)
GFR, EST AFRICAN AMERICAN: 112 mL/min/{1.73_m2} (ref >59)
GFR, EST NON AFRICAN AMERICAN: 97 mL/min/{1.73_m2} (ref >59)
Glucose: 110 mg/dL — ABNORMAL HIGH (ref 65–99)
POTASSIUM: 4.6 mmol/L (ref 3.5–5.2)
SODIUM: 139 mmol/L (ref 134–144)

## 2017-10-26 MED ORDER — CARVEDILOL 6.25 MG PO TABS: 6.2500 mg | ORAL_TABLET | Freq: Two times a day (BID) | ORAL | 3 refills | Status: DC

## 2017-10-26 NOTE — Patient Instructions (Addendum)
Medication Instructions:  Your physician recommends that you continue on your current medications as directed. Please refer to the Current Medication list given to you today.  Labwork: Your physician recommends that you have lab work today- BMET  Testing/Procedures: NONE  Follow-Up: You have been referred to Advanced Heart Failure Clinic.   Please follow up with your primary care provider for 2 incidental findings on your CT scans in the hospital - you had enlarged lymph nodes on chest CT as well as a mixed sclerotic/lytic lesion on your right iliac region (hip).  If you need a refill on your cardiac medications before your next appointment, please call your pharmacy.

## 2017-10-26 NOTE — Telephone Encounter (Signed)
Pt informed of lab results; pt verbalized understanding for medication changes and BP monitoring. New prescription sent in. Pt had no additional questions.

## 2017-10-26 NOTE — Telephone Encounter (Signed)
-----   Message from Laurann Montana, [place] sent at 10/26/2017  4:24 PM EDT ----- Please let patient know labs were normal except blood sugar mildly elevated but still below range of diabetic. Recent A1C was OK. Since we are still waiting to see when timing of f/u with CHF clinic is, I will hold off on switching to [facility], but would recommend to increase carvedilol to 6.25mg  BID.  Please ask patient to follow his BP at home regularly and if he notices it still runs greater than 125 systolic while on this higher dose of carvedilol, before next appointment, please call us so we can further increase the dose to optimize his med regimen. It would also be helpful if he check his HR at that time so we know if we are OK to continue to titrate.   Dayna Dunn PA-C

## 2017-10-26 NOTE — Addendum Note (Signed)
Addended by: Virl Axe, Jullie Arps L on: 10/26/2017 11:26 AM   Modules accepted: Orders

## 2017-11-05 ENCOUNTER — Other Ambulatory Visit: Payer: Self-pay

## 2017-11-08 MED ORDER — FUROSEMIDE 40 MG PO TABS: 40.0000 mg | ORAL_TABLET | Freq: Every day | ORAL | 3 refills | Status: DC

## 2017-11-08 MED ORDER — LOSARTAN POTASSIUM 25 MG PO TABS: 25.0000 mg | ORAL_TABLET | Freq: Every day | ORAL | 3 refills | Status: DC

## 2017-11-08 MED ORDER — SPIRONOLACTONE 25 MG PO TABS: 12.5000 mg | ORAL_TABLET | Freq: Every day | ORAL | 3 refills | Status: DC

## 2017-11-24 ENCOUNTER — Encounter

## 2017-11-24 ENCOUNTER — Other Ambulatory Visit: Payer: Self-pay

## 2017-11-24 ENCOUNTER — Ambulatory Visit (HOSPITAL_COMMUNITY): Payer: Medicaid Other | Admitting: Psychiatry

## 2017-11-24 ENCOUNTER — Ambulatory Visit (HOSPITAL_COMMUNITY)
Admission: RE | Admit: 2017-11-24 | Discharge: 2017-11-24 | Disposition: A | Discharge status: Home / Self Care | Payer: Medicaid Other | Source: Ambulatory Visit | Attending: Internal Medicine | Admitting: Internal Medicine

## 2017-11-24 ENCOUNTER — Encounter (HOSPITAL_COMMUNITY): Payer: Self-pay | Admitting: Internal Medicine

## 2017-11-24 VITALS — BP 139/99 | HR 80 | Wt 200.1 lb

## 2017-11-24 DIAGNOSIS — I110 Hypertensive heart disease with heart failure: Secondary | ICD-10-CM | POA: Insufficient documentation

## 2017-11-24 DIAGNOSIS — I447 Left bundle-branch block, unspecified: Secondary | ICD-10-CM | POA: Insufficient documentation

## 2017-11-24 DIAGNOSIS — Z79899 Other long term (current) drug therapy: Secondary | ICD-10-CM | POA: Insufficient documentation

## 2017-11-24 DIAGNOSIS — I420 Dilated cardiomyopathy: Secondary | ICD-10-CM | POA: Diagnosis not present

## 2017-11-24 DIAGNOSIS — I5020 Unspecified systolic (congestive) heart failure: Secondary | ICD-10-CM | POA: Insufficient documentation

## 2017-11-24 DIAGNOSIS — I429 Cardiomyopathy, unspecified: Secondary | ICD-10-CM | POA: Diagnosis not present

## 2017-11-24 DIAGNOSIS — R0683 Snoring: Secondary | ICD-10-CM

## 2017-11-24 DIAGNOSIS — I5042 Chronic combined systolic (congestive) and diastolic (congestive) heart failure: Secondary | ICD-10-CM

## 2017-11-24 MED ORDER — SACUBITRIL-VALSARTAN 24-26 MG PO TABS: 1.0000 | ORAL_TABLET | Freq: Two times a day (BID) | ORAL | 3 refills | Status: DC

## 2017-11-24 NOTE — Progress Notes (Signed)
Advanced Heart Failure Clinic Note   Referring Physician: Ronie Spies, PA PCP: Patient, No Pcp Per PCP-Cardiologist: Armanda Magic, MD   HPI: John Olsen is a 42 y.o. male with a history of HTN, obesity, LBBB, and new systolic HF (diagnosed 09/2017).Referred by Ronie Spies PA-C for further HF management.   Admitted 4/22-4/25/2019 and diagnosed with systolic HF. Echo showed EF 20-25%. LHC with normal coronaries. Diuresed with IV lasix and transitioned to 40 mg lasix daily. Started on HF medications per cardiology. DC weight: 206 lbs.   Saw Dayna Dunn 10/26/17. He was doing well. No medication changes were made. He was referred to advanced HF clinic for further evaluation and management of systolic HF.  He presents today to establish in the Heart Failure clinic. Overall feeling well. Denies SOB with walking back and forth from car. Not very active. Denies orthopnea, PND, edema. Denies dizziness, CP, or palpitations. No syncope or presyncope. Energy level and appetite good. Says he is limiting salt and fluid intake but drinking a Gatorade in clinic. Taking all medications. Weights ~200 lbs. Currently not working. 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   Father has CAD. No family hx of HF. Does not snore much that he is aware of. Has had HTN for at least 10 years. No viral infection prior to hospitalization.   Lives with his father. His father drives him to appointments. Able to afford medications. Denies ETOH, tobacco, or drug use.   LHC 10/13/17: widely patent coronary arteries  RHC 10/13/17: RA mean 11 RV 36/5 LVEDP 19 AO 101/73 Cardiac Output (Fick) 4.81 Cardiac Index (Fick) 2.37  Echo 10/11/17: - Left ventricle: The cavity size was severely dilated. Wall   thickness was increased in a pattern of mild LVH. Systolic   function was severely reduced. The estimated ejection fraction   was in the range of 20% to 25%. Dyskinesis of the   basal-midanteroseptal myocardium. Doppler parameters are  consistent with a reversible restrictive pattern, indicative of   decreased left ventricular diastolic compliance and/or increased   left atrial pressure (grade 3 diastolic dysfunction). - Mitral valve: There was mild regurgitation. - Left atrium: The atrium was moderately dilated. - Pulmonary arteries: Systolic pressure was moderately to severely   increased. PA peak pressure: 64 mm Hg (S). - Pericardium, extracardiac: A trivial pericardial effusion was   identified.  Review of Systems: [y] = yes, [ ]  = no   General: Weight gain [ ] ; Weight loss [ ] ; Anorexia [ ] ; Fatigue [ ] ; Fever [ ] ; Chills [ ] ; Weakness [ ]   Cardiac: Chest pain/pressure [ ] ; Resting SOB [ ] ; Exertional SOB [ ] ; Orthopnea [ ] ; Pedal Edema [ ] ; Palpitations [ ] ; Syncope [ ] ; Presyncope [ ] ; Paroxysmal nocturnal dyspnea[ ]   Pulmonary: Cough [ ] ; Wheezing[ ] ; Hemoptysis[ ] ; Sputum [ ] ; Snoring [ ]   GI: Vomiting[ ] ; Dysphagia[ ] ; Melena[ ] ; Hematochezia [ ] ; Heartburn[ ] ; Abdominal pain [ ] ; Constipation [ ] ; Diarrhea [ ] ; BRBPR [ ]   GU: Hematuria[ ] ; Dysuria [ ] ; Nocturia[ ]   Vascular: Pain in legs with walking [ ] ; Pain in feet with lying flat [ ] ; Non-healing sores [ ] ; Stroke [ ] ; TIA [ ] ; Slurred speech [ ] ;  Neuro: Headaches[ ] ; Vertigo[ ] ; Seizures[ ] ; Paresthesias[ ] ;Blurred vision [ ] ; Diplopia [ ] ; Vision changes [ ]   Ortho/Skin: Arthritis [ ] ; Joint pain [ ] ; Muscle pain [ ] ; Joint swelling [ ] ; Back Pain [ ] ; Rash [ ]   Psych: Depression[ ] ; Anxiety[ ]   Heme: Bleeding problems [ ] ; Clotting disorders [ ] ; Anemia [ ]   Endocrine: Diabetes [ ] ; Thyroid dysfunction[ ]    Past Medical History:  Diagnosis Date  . Chronic combined systolic and diastolic CHF (congestive heart failure) (HCC)   . Hypertension   . LBBB (left bundle branch block)   . NICM (nonischemic cardiomyopathy) (HCC)   . Pericardial effusion    a. trivial by echo 09/2017.    Current Outpatient Medications  Medication Sig Dispense Refill  .  carvedilol (COREG) 6.25 MG tablet Take 1 tablet (6.25 mg total) by mouth 2 (two) times daily. 180 tablet 3  . furosemide (LASIX) 40 MG tablet Take 1 tablet (40 mg total) by mouth daily. 90 tablet 3  . losartan (COZAAR) 25 MG tablet Take 1 tablet (25 mg total) by mouth daily. 90 tablet 3  . potassium chloride SA (K-DUR,KLOR-CON) 10 MEQ tablet Take 1 tablet (10 mEq total) by mouth daily. 30 tablet 0  . spironolactone (ALDACTONE) 25 MG tablet Take 0.5 tablets (12.5 mg total) by mouth daily. 45 tablet 3   No current facility-administered medications for this encounter.     Allergies  Allergen Reactions  . Penicillins       Social History   Socioeconomic History  . Marital status: Single    Spouse name: Not on file  . Number of children: Not on file  . Years of education: Not on file  . Highest education level: Not on file  Occupational History  . Occupation: Unemployed  Social Needs  . Financial resource strain: Not on file  . Food insecurity:    Worry: Not on file    Inability: Not on file  . Transportation needs:    Medical: Not on file    Non-medical: Not on file  Tobacco Use  . Smoking status: Never Smoker  . Smokeless tobacco: Never Used  Substance and Sexual Activity  . Alcohol use: Never    Frequency: Never  . Drug use: Never  . Sexual activity: Not on file  Lifestyle  . Physical activity:    Days per week: Not on file    Minutes per session: Not on file  . Stress: Not on file  Relationships  . Social connections:    Talks on phone: Not on file    Gets together: Not on file    Attends religious service: Not on file    Active member of club or organization: Not on file    Attends meetings of clubs or organizations: Not on file    Relationship status: Not on file  . Intimate partner violence:    Fear of current or ex partner: Not on file    Emotionally abused: Not on file    Physically abused: Not on file    Forced sexual activity: Not on file  Other Topics  Concern  . Not on file  Social History Narrative   Moved back to [place] to live with his parents      Family History  Problem Relation Age of Onset  . Cancer Mother   . Coronary artery disease Father        had a stent approx 2015    Vitals:   11/24/17 1410  BP: (!) 139/99  Pulse: 80  SpO2: 99%  Weight: 200 lb 1.9 oz (90.8 kg)   Wt Readings from Last 3 Encounters:  11/24/17 200 lb 1.9 oz (90.8 kg)  10/26/17 204 lb (92.5 kg)  10/13/17 206 lb 12.8 oz (93.8 kg)    PHYSICAL EXAM: General:  Well appearing. No respiratory difficulty HEENT: normal anicteric  Neck: supple. JVP 7. Carotids 2+ bilat; no bruits. No lymphadenopathy or thyromegaly appreciated. Cor: PMI nondisplaced. Regular rate & rhythm. No rubs, gallops or murmurs. Lungs: clear no wheeze  Abdomen: obese. soft, nontender, nondistended. No hepatosplenomegaly. No bruits or masses. Good bowel sounds. Extremities: no cyanosis, clubbing, rash, edema Neuro: alert & oriented x 3, cranial nerves grossly intact. moves all 4 extremities w/o difficulty. Affect pleasant   ECG: Sinus tach 104 bpm with LBBB (QRS 190 ms). Personally reviewed  ASSESSMENT & PLAN:  1. New systolic HF (diagnosed 09/2017) due to NICM. EF 20-25% Unknown etiology. LHC 10/13/17: widely patent coronary arteries. He has a wide LBBB on his EKG. No ETOH use. No family hx of HF. No recent virus. Has HTN. Does not snore that he is aware of. TSH normal. - NYHA class I, but not very active - Volume status stable on lasix 40 mg daily - Continue coreg 6.25 BIG - Stop Losartan. Start Entresto 24/26 mg BID. BMET 7-10 days - Continue spiro 12.5 mg daily - Cardiac MRI to look for infiltrative causes - Follow up every 2 weeks for 6-8 weeks with pharmacy - Follow up with Dr Gala Romney in 2-3 months with echo - Epworth sleepiness scale: 8, so he does not qualify for sleep study.   2. HTN - Elevated today. Manage with HF meds as above  3. LBBB - May be the  cause of his NICM - Will likely refer to EP after repeat echo for CRT   Alford Highland, NP 11/24/17   Patient seen and examined with the above-signed Advanced Practice Provider and/or Housestaff. I personally reviewed laboratory data, imaging studies and relevant notes. I independently examined the patient and formulated the important aspects of the plan. I have edited the note to reflect any of my changes or salient points. I have personally discussed the plan with the patient and/or family.  42 y/o male with recently diagnosed systolic HF EF 20-25% due to NICM. Cath with normal cors. Much improved with med initiation. NYHA II. Volume status looks good. I have reviewed echo, cath films and ECG. I suspect NICM due to LBBB (190 ms). Will check cMRI to exclude infiltrative process. Titrate meds as above. If no improvement in EF in next 3 months will need referral to EP for CRT-D.   Arvilla Meres, MD  11:58 PM

## 2017-11-24 NOTE — Patient Instructions (Signed)
Stop Losartan   Start Entresto 24/26 mg Twice daily, this prescription was sent to [facility], they will contact you and either ship you the medication or you can pick it up  Labs in 7-10 days  Your physician has recommended that you have a sleep study. This test records several body functions during sleep, including: brain activity, eye movement, oxygen and carbon dioxide blood levels, heart rate and rhythm, breathing rate and rhythm, the flow of air through your mouth and nose, snoring, body muscle movements, and chest and belly movement.  Your physician has requested that you have a cardiac MRI. Cardiac MRI uses a computer to create images of your heart as its beating, producing both still and moving pictures of your heart and major blood vessels. For further information please visit [email or web address]. Please follow the instruction sheet given to you today for more information.  Please follow up with Cicero Duck, our heart failure pharmacist every 2 weeks for 3 or 4 visits  Your physician recommends that you schedule a follow-up appointment in: 2-3 months with an echocardiogram

## 2017-11-25 ENCOUNTER — Telehealth (HOSPITAL_COMMUNITY): Payer: Self-pay

## 2017-11-25 NOTE — Telephone Encounter (Signed)
Patient's father Al left VM on CHF clinic traige line concerned about losartan recall in regards to [facility] Attempted to call back, no answer, did leave detailed VM addressing questions. Advised recall did not affect losartan used in entresto, and also advised that Rx was sent to [facility] for 30 day free supply and will also start PA process. Gave return call back number for our office as well as [facility] pharmacy number.  Ave Filter, RN

## 2017-11-29 ENCOUNTER — Ambulatory Visit (INDEPENDENT_AMBULATORY_CARE_PROVIDER_SITE_OTHER): Payer: Medicaid Other | Admitting: Psychology

## 2017-11-29 DIAGNOSIS — F411 Generalized anxiety disorder: Secondary | ICD-10-CM

## 2017-12-01 ENCOUNTER — Other Ambulatory Visit (HOSPITAL_COMMUNITY): Payer: Self-pay | Admitting: Internal Medicine

## 2017-12-01 ENCOUNTER — Other Ambulatory Visit (HOSPITAL_COMMUNITY): Payer: Self-pay

## 2017-12-22 ENCOUNTER — Ambulatory Visit (HOSPITAL_COMMUNITY): Payer: Self-pay

## 2017-12-22 ENCOUNTER — Telehealth (HOSPITAL_COMMUNITY): Payer: Self-pay | Admitting: *Deleted

## 2017-12-22 NOTE — Telephone Encounter (Signed)
PA approved through [facility] from 12/22/17 through 12/17/18.  Approval # [id number].

## 2017-12-22 NOTE — Telephone Encounter (Signed)
Entresto PA submitted today through [employer]. Conf# [id number].  Will wait for approval.

## 2017-12-27 ENCOUNTER — Ambulatory Visit (HOSPITAL_COMMUNITY)
Admission: RE | Admit: 2017-12-27 | Discharge: 2017-12-27 | Disposition: A | Discharge status: Home / Self Care | Payer: Medicaid Other | Source: Ambulatory Visit | Attending: Cardiology | Admitting: Cardiology

## 2017-12-27 VITALS — BP 158/102 | HR 88 | Wt 211.0 lb

## 2017-12-27 DIAGNOSIS — I5022 Chronic systolic (congestive) heart failure: Secondary | ICD-10-CM | POA: Insufficient documentation

## 2017-12-27 LAB — BASIC METABOLIC PANEL WITH GFR
Anion gap: 9 (ref 5–15)
BUN: 21 mg/dL — ABNORMAL HIGH (ref 6–20)
CO2: 27 mmol/L (ref 22–32)
Calcium: 8.7 mg/dL — ABNORMAL LOW (ref 8.9–10.3)
Chloride: 100 mmol/L (ref 98–111)
Creatinine, Ser: 0.89 mg/dL (ref 0.61–1.24)
GFR calc Af Amer: >60 mL/min
GFR calc non Af Amer: >60 mL/min
Glucose, Bld: 116 mg/dL — ABNORMAL HIGH (ref 70–99)
Potassium: 3.8 mmol/L (ref 3.5–5.1)
Sodium: 136 mmol/L (ref 135–145)

## 2017-12-27 MED ORDER — LOSARTAN POTASSIUM 25 MG PO TABS: 25.0000 mg | ORAL_TABLET | Freq: Two times a day (BID) | ORAL | 6 refills | Status: DC

## 2017-12-27 MED ORDER — SPIRONOLACTONE 25 MG PO TABS: 25.0000 mg | ORAL_TABLET | Freq: Every day | ORAL | 6 refills | Status: DC

## 2017-12-27 NOTE — Progress Notes (Signed)
Referring Physician: Ronie Spies, PA PCP: Patient, No Pcp Per PCP-Cardiologist: Armanda Magic, MD HF MD Bensimhon   HPI:   John Olsen is a 42 y.o. male with a history of HTN, obesity, LBBB, and new systolic HF (diagnosed 09/2017).Referred by Ronie Spies PA-C for further HF management.   Admitted 4/22-4/25/2019 and diagnosed with systolic HF. Echo showed EF 20-25%. LHC with normal coronaries. Diuresed with IV lasix and transitioned to 40 mg lasix daily. Started on HF medications per cardiology. DC weight: 206 lbs.   Saw Dayna Dunn 10/26/17. He was doing well. No medication changes were made. He was referred to advanced HF clinic for further evaluation and management of systolic HF.  He presents today to initial pharmacy medication titration clinic.  He recently establish in the Heart Failure clinic where losartan was stopped and Entresto started.  He only took Chief Financial Officer for couple of days and didn't like the effects so went back to losartan.   Denies SOB with short walks < 1 mi, he is not very active during the day. Denies orthopnea, PND, edema. Denies dizziness, CP, or palpitations. No syncope or presyncope. Energy level and appetite good -eating a lot more,  he likes to snack on chips, and graham crackers. Says he is limiting salt and fluid intake but drinking a Power Aid in clinic. Taking all medications. Weight is up 10lbs from last visit.    Father has CAD. No family hx of HF. Does not snore much that he is aware of. Has had HTN for at least 10 years. No viral infection prior to hospitalization.   Lives with his father. His father drives him to appointments. Able to afford medications. Denies ETOH, tobacco, or drug use.      Marland Kitchen Shortness of breath/dyspnea on exertion? no  . Orthopnea/PND? no . Edema? no . Lightheadedness/dizziness? no . Daily weights at home? no . Blood pressure/heart rate monitoring at home? no . Following low-sodium/fluid-restricted diet? no  HF  Medications: Carvedilol 6.25mg  BID Furosemide 40mg  Daily Potassium Daily Losartan 25mg  Daily  Spironolactone 12.5mg  Daily  Has the patient been experiencing any side effects to the medications prescribed?  no  Does the patient have any problems obtaining medications due to transportation or finances?   no  Understanding of regimen: fair Understanding of indications: fair Potential of compliance: fair Patient understands to avoid NSAIDs. Patient understands to avoid decongestants.    Pertinent Lab Values: 12/27/17 . Serum creatinine 0.89 (stable)  CO2 27 Potassium 3.8 (stable), Sodium 136,    Vital Signs: . Weight: 211lb (dry weight: 200lb) . Blood pressure: 158/102 no meds this am  . Heart rate: 88 . O2 97%  Assessment: 1. Chronicsystolic CHF (EF 23%), due to NICM. NYHA class II-IIIsymptoms. -volume status is stable - no increased SOB, no LE edema, Weight gain is more related to increased appetite -BP 158/102 HR 88 - elevated today - he states he has not take am doses of medications yet - states compliance with medications except for not wanting to take [facility] for fear of side effects - did not have any just did not want them  - will increase Losartan today and  Encouraged him to re-consider Entresto in future. -lab- Cr and K stable  - plan to Increase Losartan 25mg  BID and increase Spironolactone 25mg  Daily -continue other Current HF medications -Carvedilol 6.25mg  BID Furosemide 40mg  Daily Potassium Daily  - Basic disease state pathophysiology, medication indication, mechanism and side effects reviewed at length with patient and  he verbalized understanding   2. HTN - Elevated today. Manage with HF meds  3. LBBB - May be the cause of his NICM - Will likely refer to EP after repeat echo for CRT - per MD   Plan: 1) Medication changes: Based on clinical presentation volume status is stable -  Weight gain is more related to increased appetite -BP  158/102 HR 88 - elevated today  - will increase Losartan today and  Encouraged him to re-consider Entresto in future. -lab- Cr and K stable  - Increase Losartan 25mg  BID and increase Spironolactone 25mg  Daily -continue other Current HF medicaitons -Carvedilol 6.25mg  BID Furosemide 40mg  Daily Potassium Daily  2) Labs: BMET at next visit 3) Follow-up: Pharmacist clinic July 24   Ricarda Frame.D. CPP, BCPS Clinical Pharmacist [phone] 12/31/2017 11:13 AM

## 2017-12-27 NOTE — Patient Instructions (Addendum)
Increase Losartan 25mg  twice daily Increase Spironolactone 1 whole tablet = 25mg  Daily Continue all other medications Consider retrying Sherryll Burger  - we can discuss in future Walk as tolerated Choose some healthy snacks in between meals Follow Up HF clinic Pharmacist clinic July 24 at 3pm

## 2018-01-12 ENCOUNTER — Other Ambulatory Visit (HOSPITAL_COMMUNITY): Payer: Self-pay

## 2018-02-02 ENCOUNTER — Ambulatory Visit (INDEPENDENT_AMBULATORY_CARE_PROVIDER_SITE_OTHER): Payer: Medicaid Other | Admitting: Psychology

## 2018-02-02 DIAGNOSIS — F411 Generalized anxiety disorder: Secondary | ICD-10-CM | POA: Diagnosis not present

## 2018-02-23 ENCOUNTER — Other Ambulatory Visit (HOSPITAL_COMMUNITY): Payer: Self-pay | Admitting: *Deleted

## 2018-02-23 DIAGNOSIS — I5022 Chronic systolic (congestive) heart failure: Secondary | ICD-10-CM

## 2018-02-23 NOTE — Progress Notes (Signed)
Advanced Heart Failure Clinic Note   Referring Physician: Ronie Spies, PA PCP: Patient, No Pcp Per PCP-Cardiologist: Armanda Magic, MD  HF MD; Dr Gala Romney  HPI: John Olsen is a 42 y.o. male with a history of HTN, obesity, LBBB, and new systolic HF (diagnosed 09/2017).Referred by Ronie Spies PA-C for further HF management.   Admitted 4/22-4/25/2019 and diagnosed with systolic HF. Echo showed EF 20-25%. LHC with normal coronaries. Diuresed with IV lasix and transitioned to 40 mg lasix daily. Started on HF medications per cardiology. DC weight: 206 lbs.   Today he returns for HF follow up. Last visit losartan was stopped and entresto was started. He later stopped entresto after a few days. Overall feeling fine. Denies SOB/PND/Orthopnea. Has been more active.  Appetite ok. No fever or chills. Weight at home has been going up. Says he has been eating a lot. Taking all medications. He has been swimming without difficulty. No dizziness.   Father has CAD. No family hx of HF. Does not snore much that he is aware of. Has had HTN for at least 10 years. No viral infection prior to hospitalization.   Lives with his father. His father drives him to appointments. Able to afford medications. Denies ETOH, tobacco, or drug use.   LHC 10/13/17: widely patent coronary arteries  RHC 10/13/17: RA mean 11 RV 36/5 LVEDP 19 AO 101/73 Cardiac Output (Fick) 4.81 Cardiac Index (Fick) 2.37  Echo 10/11/17: - Left ventricle: The cavity size was severely dilated. Wall   thickness was increased in a pattern of mild LVH. Systolic   function was severely reduced. The estimated ejection fraction   was in the range of 20% to 25%. Dyskinesis of the   basal-midanteroseptal myocardium. Doppler parameters are   consistent with a reversible restrictive pattern, indicative of   decreased left ventricular diastolic compliance and/or increased   left atrial pressure (grade 3 diastolic dysfunction). - Mitral valve: There was  mild regurgitation. - Left atrium: The atrium was moderately dilated. - Pulmonary arteries: Systolic pressure was moderately to severely   increased. PA peak pressure: 64 mm Hg (S). - Pericardium, extracardiac: A trivial pericardial effusion was   identified.    Past Medical History:  Diagnosis Date  . Chronic combined systolic and diastolic CHF (congestive heart failure) (HCC)   . Hypertension   . LBBB (left bundle branch block)   . NICM (nonischemic cardiomyopathy) (HCC)   . Pericardial effusion    a. trivial by echo 09/2017.    Current Outpatient Medications  Medication Sig Dispense Refill  . carvedilol (COREG) 6.25 MG tablet Take 1 tablet (6.25 mg total) by mouth 2 (two) times daily. 180 tablet 3  . furosemide (LASIX) 40 MG tablet Take 1 tablet (40 mg total) by mouth daily. 90 tablet 3  . losartan (COZAAR) 25 MG tablet Take 1 tablet (25 mg total) by mouth 2 (two) times daily. 60 tablet 6  . potassium chloride SA (K-DUR,KLOR-CON) 10 MEQ tablet Take 1 tablet (10 mEq total) by mouth daily. 30 tablet 0  . spironolactone (ALDACTONE) 25 MG tablet Take 1 tablet (25 mg total) by mouth daily. 30 tablet 6   No current facility-administered medications for this encounter.     Allergies  Allergen Reactions  . Penicillins Hives    As child      Social History   Socioeconomic History  . Marital status: Single    Spouse name: Not on file  . Number of children: Not on file  .  Years of education: Not on file  . Highest education level: Not on file  Occupational History  . Occupation: Unemployed  Social Needs  . Financial resource strain: Not on file  . Food insecurity:    Worry: Not on file    Inability: Not on file  . Transportation needs:    Medical: Not on file    Non-medical: Not on file  Tobacco Use  . Smoking status: Never Smoker  . Smokeless tobacco: Never Used  Substance and Sexual Activity  . Alcohol use: Never    Frequency: Never  . Drug use: Never  . Sexual  activity: Not on file  Lifestyle  . Physical activity:    Days per week: Not on file    Minutes per session: Not on file  . Stress: Not on file  Relationships  . Social connections:    Talks on phone: Not on file    Gets together: Not on file    Attends religious service: Not on file    Active member of club or organization: Not on file    Attends meetings of clubs or organizations: Not on file    Relationship status: Not on file  . Intimate partner violence:    Fear of current or ex partner: Not on file    Emotionally abused: Not on file    Physically abused: Not on file    Forced sexual activity: Not on file  Other Topics Concern  . Not on file  Social History Narrative   Moved back to [place] to live with his parents      Family History  Problem Relation Age of Onset  . Cancer Mother   . Coronary artery disease Father        had a stent approx 2015    Vitals:   02/24/18 1343  BP: (!) 144/82  Pulse: 77  SpO2: 99%  Weight: 100.7 kg (222 lb)   Wt Readings from Last 3 Encounters:  02/24/18 100.7 kg (222 lb)  12/27/17 95.7 kg (211 lb)  11/24/17 90.8 kg (200 lb 1.9 oz)    PHYSICAL EXAM: General:  Well appearing. No resp difficulty HEENT: normal Neck: supple. no JVD. Carotids 2+ bilat; no bruits. No lymphadenopathy or thryomegaly appreciated. Cor: PMI nondisplaced. Regular rate & rhythm. No rubs, gallops or murmurs. Lungs: clear Abdomen: obese, soft, nontender, nondistended. No hepatosplenomegaly. No bruits or masses. Good bowel sounds. Extremities: no cyanosis, clubbing, rash, edema Neuro: alert & orientedx3, cranial nerves grossly intact. moves all 4 extremities w/o difficulty. Affect pleasant    ASSESSMENT & PLAN:  1. New systolic HF (diagnosed 09/2017) due to NICM. EF 20-25% Unknown etiology. LHC 10/13/17: widely patent coronary arteries. He has a wide LBBB on his EKG. No ETOH use. No family hx of HF. No recent virus. Has HTN. Does not snore that he is  aware of. TSH normal. NYHA I. Volume status stable despite weight gain.  - Volume status stable on lasix 40 mg daily - Continue coreg to 6.25 mg twice a day.    Continue spiro 25 mg daily.  - Intolerant entresto- (made him feel bad but could not give additional details) - Increase losartan to 50 mg twice a day. BMET in 7 days.  - Has not had  Cardiac MRI to look for infiltrative causes  - ECHO EF getting better. EF now 40-45%. He is out of the window for ICD.   2. HTN - Elevated. Increase losartan as above.  3. LBBB - May be the cause of his NICM - EF improving so not candidate for CRT-D at this time   Todays ECHO was discussed and reviewed by Dr Gala Romney. EF improving.   Follow up in 3-4 months. If ok in at next visit will send back to Dr Mayford Knife.    Tonye Becket, NP 02/24/18   Patient seen and examined with Tonye Becket, NP. We discussed all aspects of the encounter. I agree with the assessment and plan as stated above.    Overall doing well. NYHA II. Did not tolerate Entresto well. Volume status ok. Echo reviewed personally EF 40-45%. BP high. Continue to titrate medical therapy. Increase losartan to 50 bid. Not candidate for CRT-D with EF improving.   Arvilla Meres, MD  3:31 PM

## 2018-02-24 ENCOUNTER — Ambulatory Visit (HOSPITAL_COMMUNITY)
Admission: RE | Admit: 2018-02-24 | Discharge: 2018-02-24 | Disposition: A | Discharge status: Home / Self Care | Payer: Medicaid Other | Source: Ambulatory Visit | Attending: Internal Medicine | Admitting: Internal Medicine

## 2018-02-24 ENCOUNTER — Other Ambulatory Visit: Payer: Self-pay

## 2018-02-24 ENCOUNTER — Ambulatory Visit (HOSPITAL_BASED_OUTPATIENT_CLINIC_OR_DEPARTMENT_OTHER)
Admission: RE | Admit: 2018-02-24 | Discharge: 2018-02-24 | Disposition: A | Discharge status: Home / Self Care | Payer: Medicaid Other | Source: Ambulatory Visit | Attending: Internal Medicine | Admitting: Internal Medicine

## 2018-02-24 ENCOUNTER — Encounter (HOSPITAL_COMMUNITY): Payer: Self-pay | Admitting: Internal Medicine

## 2018-02-24 VITALS — BP 144/82 | HR 77 | Wt 222.0 lb

## 2018-02-24 DIAGNOSIS — Z880 Allergy status to penicillin: Secondary | ICD-10-CM | POA: Insufficient documentation

## 2018-02-24 DIAGNOSIS — I110 Hypertensive heart disease with heart failure: Secondary | ICD-10-CM | POA: Insufficient documentation

## 2018-02-24 DIAGNOSIS — Z79899 Other long term (current) drug therapy: Secondary | ICD-10-CM | POA: Insufficient documentation

## 2018-02-24 DIAGNOSIS — I5042 Chronic combined systolic (congestive) and diastolic (congestive) heart failure: Secondary | ICD-10-CM | POA: Diagnosis not present

## 2018-02-24 DIAGNOSIS — E669 Obesity, unspecified: Secondary | ICD-10-CM | POA: Insufficient documentation

## 2018-02-24 DIAGNOSIS — I420 Dilated cardiomyopathy: Secondary | ICD-10-CM | POA: Diagnosis not present

## 2018-02-24 DIAGNOSIS — I428 Other cardiomyopathies: Secondary | ICD-10-CM | POA: Diagnosis not present

## 2018-02-24 DIAGNOSIS — I447 Left bundle-branch block, unspecified: Secondary | ICD-10-CM | POA: Insufficient documentation

## 2018-02-24 DIAGNOSIS — I5022 Chronic systolic (congestive) heart failure: Secondary | ICD-10-CM | POA: Diagnosis not present

## 2018-02-24 LAB — ECHOCARDIOGRAM COMPLETE: WEIGHTICAEL: 3552 [oz_av]

## 2018-02-24 MED ORDER — LOSARTAN POTASSIUM 50 MG PO TABS: 50.0000 mg | ORAL_TABLET | Freq: Two times a day (BID) | ORAL | 3 refills | Status: DC

## 2018-02-24 NOTE — Progress Notes (Signed)
  Echocardiogram 2D Echocardiogram has been performed.  Tye Savoy 02/24/2018, 2:07 PM

## 2018-02-24 NOTE — Patient Instructions (Signed)
INCREASE Losartan to 50mg  twice daily.  Labs in 7 days.  Follow up in 3-4 months.

## 2018-03-03 ENCOUNTER — Ambulatory Visit (HOSPITAL_COMMUNITY)
Admission: RE | Admit: 2018-03-03 | Discharge: 2018-03-03 | Disposition: A | Discharge status: Home / Self Care | Payer: Medicaid Other | Source: Ambulatory Visit | Attending: Internal Medicine | Admitting: Internal Medicine

## 2018-03-03 DIAGNOSIS — I420 Dilated cardiomyopathy: Secondary | ICD-10-CM | POA: Diagnosis present

## 2018-03-03 LAB — BASIC METABOLIC PANEL
ANION GAP: 11 (ref 5–15)
BUN: 20 mg/dL (ref 6–20)
CALCIUM: 8.8 mg/dL — AB (ref 8.9–10.3)
CO2: 25 mmol/L (ref 22–32)
Chloride: 101 mmol/L (ref 98–111)
Creatinine, Ser: 1.07 mg/dL (ref 0.61–1.24)
GFR calc Af Amer: >60 mL/min (ref >60)
GFR calc non Af Amer: >60 mL/min (ref >60)
Glucose, Bld: 168 mg/dL — ABNORMAL HIGH (ref 70–99)
Potassium: 4 mmol/L (ref 3.5–5.1)
Sodium: 137 mmol/L (ref 135–145)

## 2018-03-20 ENCOUNTER — Other Ambulatory Visit (HOSPITAL_COMMUNITY): Payer: Self-pay | Admitting: Internal Medicine

## 2018-03-22 ENCOUNTER — Ambulatory Visit (INDEPENDENT_AMBULATORY_CARE_PROVIDER_SITE_OTHER): Payer: Medicaid Other | Admitting: Psychology

## 2018-03-22 DIAGNOSIS — F411 Generalized anxiety disorder: Secondary | ICD-10-CM | POA: Diagnosis not present

## 2018-05-18 ENCOUNTER — Ambulatory Visit (INDEPENDENT_AMBULATORY_CARE_PROVIDER_SITE_OTHER): Payer: Medicaid Other | Admitting: Psychology

## 2018-05-18 DIAGNOSIS — F411 Generalized anxiety disorder: Secondary | ICD-10-CM | POA: Diagnosis not present

## 2018-05-31 ENCOUNTER — Encounter (HOSPITAL_COMMUNITY): Payer: Self-pay | Admitting: Internal Medicine

## 2018-08-03 ENCOUNTER — Ambulatory Visit (INDEPENDENT_AMBULATORY_CARE_PROVIDER_SITE_OTHER): Payer: Medicaid Other | Admitting: Psychology

## 2018-08-03 DIAGNOSIS — F411 Generalized anxiety disorder: Secondary | ICD-10-CM | POA: Diagnosis not present

## 2018-08-30 ENCOUNTER — Other Ambulatory Visit: Payer: Self-pay | Admitting: Physician Assistant

## 2018-09-05 ENCOUNTER — Other Ambulatory Visit (HOSPITAL_COMMUNITY): Payer: Self-pay | Admitting: Internal Medicine

## 2018-10-05 ENCOUNTER — Other Ambulatory Visit (HOSPITAL_COMMUNITY): Payer: Self-pay

## 2018-10-05 MED ORDER — LOSARTAN POTASSIUM 50 MG PO TABS: 50.0000 mg | ORAL_TABLET | Freq: Two times a day (BID) | ORAL | 0 refills | Status: DC

## 2018-10-10 ENCOUNTER — Ambulatory Visit: Payer: Medicaid Other | Admitting: Psychology

## 2018-10-10 ENCOUNTER — Ambulatory Visit (INDEPENDENT_AMBULATORY_CARE_PROVIDER_SITE_OTHER): Payer: Medicaid Other | Admitting: Psychology

## 2018-10-10 DIAGNOSIS — F411 Generalized anxiety disorder: Secondary | ICD-10-CM

## 2018-10-22 ENCOUNTER — Other Ambulatory Visit: Payer: Self-pay | Admitting: Physician Assistant

## 2018-10-28 ENCOUNTER — Other Ambulatory Visit (HOSPITAL_COMMUNITY): Payer: Self-pay

## 2018-10-28 MED ORDER — SPIRONOLACTONE 25 MG PO TABS: 25.0000 mg | ORAL_TABLET | Freq: Every day | ORAL | 1 refills | Status: DC

## 2018-10-28 NOTE — Telephone Encounter (Signed)
Received VM from [facility] to refill John Olsen. Pt needs f/u. 2 month given, however need f/u

## 2018-11-08 ENCOUNTER — Other Ambulatory Visit (HOSPITAL_COMMUNITY): Payer: Self-pay | Admitting: Internal Medicine

## 2018-12-08 ENCOUNTER — Other Ambulatory Visit: Payer: Self-pay | Admitting: Physician Assistant

## 2019-01-04 ENCOUNTER — Telehealth (HOSPITAL_COMMUNITY): Payer: Self-pay

## 2019-01-04 ENCOUNTER — Other Ambulatory Visit: Payer: Self-pay | Admitting: Physician Assistant

## 2019-01-04 ENCOUNTER — Other Ambulatory Visit (HOSPITAL_COMMUNITY): Payer: Self-pay | Admitting: Internal Medicine

## 2019-01-04 NOTE — Telephone Encounter (Signed)
Received vm from patient inquiring about cardiac clearance for dental procedure. Clearance on MD desk, will have MD sign once in office tomorrow. Attempted to call patient to make him aware, no answer and unable to leave message

## 2019-01-05 NOTE — Telephone Encounter (Signed)
Clearance faxed to [facility] for [facility]. Confirmation of receipt received. Pt made aware of same.

## 2019-01-14 ENCOUNTER — Other Ambulatory Visit: Payer: Self-pay | Admitting: Physician Assistant

## 2019-01-18 ENCOUNTER — Other Ambulatory Visit: Payer: Self-pay | Admitting: Physician Assistant

## 2019-01-18 MED ORDER — FUROSEMIDE 40 MG PO TABS: 40.0000 mg | ORAL_TABLET | Freq: Every day | ORAL | 0 refills | Status: DC

## 2019-01-18 MED ORDER — CARVEDILOL 6.25 MG PO TABS: ORAL_TABLET | ORAL | 0 refills | Status: DC

## 2019-01-23 ENCOUNTER — Ambulatory Visit (INDEPENDENT_AMBULATORY_CARE_PROVIDER_SITE_OTHER): Payer: Medicaid Other | Admitting: Psychology

## 2019-01-23 DIAGNOSIS — F411 Generalized anxiety disorder: Secondary | ICD-10-CM | POA: Diagnosis not present

## 2019-02-18 ENCOUNTER — Other Ambulatory Visit (HOSPITAL_COMMUNITY): Payer: Self-pay | Admitting: Internal Medicine

## 2019-03-16 ENCOUNTER — Other Ambulatory Visit: Payer: Self-pay | Admitting: Physician Assistant

## 2019-03-27 ENCOUNTER — Other Ambulatory Visit: Payer: Self-pay | Admitting: Physician Assistant

## 2019-04-16 ENCOUNTER — Other Ambulatory Visit: Payer: Self-pay | Admitting: Physician Assistant

## 2019-04-18 ENCOUNTER — Other Ambulatory Visit: Payer: Self-pay | Admitting: Cardiology

## 2019-04-18 ENCOUNTER — Telehealth: Payer: Self-pay | Admitting: Cardiology

## 2019-04-18 MED ORDER — FUROSEMIDE 40 MG PO TABS: 40.0000 mg | ORAL_TABLET | Freq: Every day | ORAL | 0 refills | Status: DC

## 2019-04-18 MED ORDER — CARVEDILOL 6.25 MG PO TABS: ORAL_TABLET | ORAL | 0 refills | Status: DC

## 2019-04-18 NOTE — Telephone Encounter (Signed)
°*  STAT* If patient is at the pharmacy, call can be transferred to refill team.   1. Which medications need to be refilled? (please list name of each medication and dose if known)  carvedilol (COREG) 6.25 MG tablet furosemide (LASIX) 40 MG tablet   2. Which pharmacy/location (including street and city if local pharmacy) is medication to be sent to?   3. Do they need a 30 day or 90 day supply? 30   Pt is out of medication  Pt has appt with Dr. Radford Pax 04/24/19.

## 2019-04-18 NOTE — Telephone Encounter (Signed)
Pt's medications were sent to pt's pharmacy as requested. Confirmation received.  

## 2019-04-19 ENCOUNTER — Other Ambulatory Visit: Payer: Self-pay | Admitting: Cardiology

## 2019-04-19 MED ORDER — FUROSEMIDE 40 MG PO TABS: 40.0000 mg | ORAL_TABLET | Freq: Every day | ORAL | 0 refills | Status: DC

## 2019-04-19 NOTE — Addendum Note (Signed)
Addended by: Derl Barrow on: 04/19/2019 10:54 AM   Modules accepted: Orders

## 2019-04-23 NOTE — Progress Notes (Signed)
Cardiology Office Note:    Date:  04/24/2019   ID:  John Olsen, DOB [birth year], MRN [id number]  PCP:  Patient, No Pcp Per  Cardiologist:  Armanda Magic, MD    Referring MD: No ref. provider found   Chief Complaint  Patient presents with  . Congestive Heart Failure  . Hypertension    History of Present Illness:    John Olsen is a 43 y.o. male with a hx of HTN, obesity, LBBB, and chronic systolic HF (diagnosed 09/2017).  Admitted 4/22-4/25/2019 and diagnosed with systolic HF. Echo showed EF 20-25%. LHC with normal coronaries.  He has been followed by AHF service and recently discharged from their care.  He has a wide LBBB on his EKG. No ETOH use. No family hx of HF.  He was intolerant to [facility] and has been on Losartan, carvedilol and Cleda Daub.  His last EF assessment showed an EF of 40-45% 02/2018.    he is here today for followup and is doing well.  He denies any chest pain or pressure, SOB, DOE, PND, orthopnea, LE edema, dizziness, palpitations or syncope. He is compliant with his meds and is tolerating meds with no SE.    Past Medical History:  Diagnosis Date  . Chronic combined systolic and diastolic CHF (congestive heart failure) (HCC)   . Hypertension   . LBBB (left bundle branch block)   . NICM (nonischemic cardiomyopathy) (HCC)   . Pericardial effusion    a. trivial by echo 09/2017.    Past Surgical History:  Procedure Laterality Date  . NEUROBLASTOMA EXCISION     Had prior to 43 year of age, abdominal  . RIGHT/LEFT HEART CATH AND CORONARY ANGIOGRAPHY N/A 10/13/2017   Procedure: RIGHT/LEFT HEART CATH AND CORONARY ANGIOGRAPHY;  Surgeon: Lyn Records, MD;  Location: [facility] INVASIVE CV LAB;  Service: Cardiovascular;  Laterality: N/A;  . ULTRASOUND GUIDANCE FOR VASCULAR ACCESS  10/13/2017   Procedure: Ultrasound Guidance For Vascular Access;  Surgeon: Lyn Records, MD;  Location: [facility] INVASIVE CV LAB;  Service: Cardiovascular;;    Current Medications: Current Meds   Medication Sig  . carvedilol (COREG) 6.25 MG tablet Take 1 tablet (6.25 mg total) by mouth 2 (two) times daily with a meal.  . furosemide (LASIX) 40 MG tablet Take 1 tablet (40 mg total) by mouth daily. Please keep upcoming appt in November with Dr. Mayford Knife before anymore refills. Thank you  . losartan (COZAAR) 50 MG tablet TAKE 1 TABLET BY MOUTH TWICE DAILY.MAKE APPOINTMENT AT [phone]  . spironolactone (ALDACTONE) 25 MG tablet TAKE 1 TABLET(25 MG) BY MOUTH DAILY.NEED APPOINTMENT FOR FUTURE REFILS PER MD     Allergies:   Penicillins   Social History   Socioeconomic History  . Marital status: Single    Spouse name: Not on file  . Number of children: Not on file  . Years of education: Not on file  . Highest education level: Not on file  Occupational History  . Occupation: Unemployed  Social Needs  . Financial resource strain: Not on file  . Food insecurity    Worry: Not on file    Inability: Not on file  . Transportation needs    Medical: Not on file    Non-medical: Not on file  Tobacco Use  . Smoking status: Never Smoker  . Smokeless tobacco: Never Used  Substance and Sexual Activity  . Alcohol use: Never    Frequency: Never  . Drug use: Never  . Sexual activity: Not  on file  Lifestyle  . Physical activity    Days per week: Not on file    Minutes per session: Not on file  . Stress: Not on file  Relationships  . Social Herbalist on phone: Not on file    Gets together: Not on file    Attends religious service: Not on file    Active member of club or organization: Not on file    Attends meetings of clubs or organizations: Not on file    Relationship status: Not on file  Other Topics Concern  . Not on file  Social History Narrative   Moved back to [place] to live with his parents     Family History: The patient's family history includes Cancer in his mother; Coronary artery disease in his father.  ROS:   Please see the history of present  illness.    ROS  All other systems reviewed and negative.   EKGs/Labs/Other Studies Reviewed:    The following studies were reviewed today: 2D echo 2019  EKG:  EKG is  ordered today.  The ekg ordered today demonstrates NSR with LBBB  Recent Labs: No results found for requested labs within last 8760 hours.   Recent Lipid Panel    Component Value Date/Time   CHOL 121 10/11/2017 0226   TRIG 72 10/11/2017 0226   HDL 29 (L) 10/11/2017 0226   CHOLHDL 4.2 10/11/2017 0226   VLDL 14 10/11/2017 0226   LDLCALC 78 10/11/2017 0226    Physical Exam:    VS:  BP 138/90   Pulse (!) 103   Ht 5\' 6"  (1.676 m)   Wt 238 lb 6.4 oz (108.1 kg)   BMI 38.48 kg/m     Wt Readings from Last 3 Encounters:  04/24/19 238 lb 6.4 oz (108.1 kg)  02/24/18 222 lb (100.7 kg)  12/27/17 211 lb (95.7 kg)     GEN:  Well nourished, well developed in no acute distress HEENT: Normal NECK: No JVD; No carotid bruits LYMPHATICS: No lymphadenopathy CARDIAC: RRR, no murmurs, rubs, gallops RESPIRATORY:  Clear to auscultation without rales, wheezing or rhonchi  ABDOMEN: Soft, non-tender, non-distended MUSCULOSKELETAL:  No edema; No deformity  SKIN: Warm and dry NEUROLOGIC:  Alert and oriented x 3 PSYCHIATRIC:  Normal affect   ASSESSMENT:    1. Chronic systolic heart failure ([place])   2. Essential hypertension   3. LBBB (left bundle branch block)    PLAN:    In order of problems listed above:  1.  Chronic systolic CHF -NYHA class I -he does not appear volume overloaded -continu, Lasix 40mg  daily, Spiro 25mg  daily and Losartan 50mg  BID -HR is mildly elevated this am so increase Carvedilol to 12.5mg  BID -followup with PA in 2-3 weeks -Check BMET today  2.  HTN -BP controlled -continue BB, ARB and spiro  3.  LBBB -normal coronary arteries on cath -not candidate for CRT-D since EF has improved    Medication Adjustments/Labs and Tests Ordered: Current medicines are reviewed at length with the  patient today.  Concerns regarding medicines are outlined above.  Orders Placed This Encounter  Procedures  . EKG 12-Lead   No orders of the defined types were placed in this encounter.   Signed, Fransico Him, MD  04/24/2019 11:59 AM    Grand[facility]

## 2019-04-24 ENCOUNTER — Ambulatory Visit: Payer: Medicaid Other | Admitting: Cardiology

## 2019-04-24 ENCOUNTER — Other Ambulatory Visit: Payer: Self-pay

## 2019-04-24 ENCOUNTER — Encounter: Payer: Self-pay | Admitting: Cardiology

## 2019-04-24 VITALS — BP 138/90 | HR 103 | Ht 66.0 in | Wt 238.4 lb

## 2019-04-24 DIAGNOSIS — I10 Essential (primary) hypertension: Secondary | ICD-10-CM

## 2019-04-24 DIAGNOSIS — I5022 Chronic systolic (congestive) heart failure: Secondary | ICD-10-CM | POA: Diagnosis not present

## 2019-04-24 DIAGNOSIS — I447 Left bundle-branch block, unspecified: Secondary | ICD-10-CM

## 2019-04-24 MED ORDER — CARVEDILOL 12.5 MG PO TABS: 12.5000 mg | ORAL_TABLET | Freq: Two times a day (BID) | ORAL | 3 refills | Status: DC

## 2019-04-24 NOTE — Patient Instructions (Signed)
Medication Instructions:  1) INCREASE COREG to 12.5 mg twice daily *If you need a refill on your cardiac medications before your next appointment, please call your pharmacy*   Follow-Up: You have an appointment with Dr. Theodosia Blender assistant, Richardson Dopp, on May 15, 2019 at 2:15PM.

## 2019-04-28 ENCOUNTER — Telehealth: Payer: Self-pay

## 2019-04-28 MED ORDER — FUROSEMIDE 40 MG PO TABS: 40.0000 mg | ORAL_TABLET | Freq: Every day | ORAL | 3 refills | Status: DC

## 2019-04-28 NOTE — Telephone Encounter (Signed)
Refill for Furosemide sent to pharmacy. 

## 2019-05-13 NOTE — Progress Notes (Signed)
Cardiology Office Note:    Date:  05/15/2019   ID:  Robina Ade, DOB 05-Jan-1976, MRN [id number]  PCP:  Patient, No Pcp Per  Cardiologist:  Fransico Him, MD  Electrophysiologist:  None   Referring MD: No ref. provider found   Chief Complaint: follow-up of chronic combined CHF  History of Present Illness:    Sultan Pargas is a 43 y.o. male with a history of normal coronary arteries on cardiac cath in 09/2017, chronic combined CHF/nonischemic cardiomyopathy with EF of 40-45% on Echo in 02/2018, LBBB, hypertension, and obesity who is followed by Dr. Radford Pax and presents today for follow-up of chronic combined CHF.   Patient was admitted in 09/2017 and diagnosed with acute combined CHF with EF of 20-25%. Patient underwent right/left heart catheterization during that admission which showed normal coronary arteries with LVEDP of 19 mmHg and RVSP of 36 mmHg. Guideline directed therapy for systolic CHF was recommended. Patient was followed by our advance heart failure team for a while but was recently discharged from their care. He was intolerant to [facility] and has been on Losartan, Coreg, and Spironolactone. Most recent Echo from 02/2018 showed EF of 40-45% with diffuse hypokinesis, grade 2 diastolic dysfunction, and mildly increased PASP of 35 mmHg.  Patient was last seen by Dr. Radford Pax on 04/24/2019 at which time he was doing well and denied any cardiac symptoms. Heart rate mildly elevated at that time so Coreg was increased to 12.5mg  twice daily and patient was instructed to follow-up with APP in 2-3 weeks.   Patient presents today for follow-up.  Heart rate better controlled today at 85 bpm.  He has tolerated the increased dose of Coreg well.  Denies any chest pain, shortness of breath, palpitations, lightheadedness, dizziness, syncope, orthopnea, PND, edema.  He has no additional questions or concerns today.   Past Medical History:  Diagnosis Date  . Chronic combined systolic and diastolic CHF  (congestive heart failure) ([place])   . Hypertension   . LBBB (left bundle branch block)   . NICM (nonischemic cardiomyopathy) ([place])   . Pericardial effusion    a. trivial by echo 09/2017.    Past Surgical History:  Procedure Laterality Date  . NEUROBLASTOMA EXCISION     Had prior to 43 year of age, abdominal  . RIGHT/LEFT HEART CATH AND CORONARY ANGIOGRAPHY N/A 10/13/2017   Procedure: RIGHT/LEFT HEART CATH AND CORONARY ANGIOGRAPHY;  Surgeon: Belva Crome, MD;  Location: [place] CV LAB;  Service: Cardiovascular;  Laterality: N/A;  . ULTRASOUND GUIDANCE FOR VASCULAR ACCESS  10/13/2017   Procedure: Ultrasound Guidance For Vascular Access;  Surgeon: Belva Crome, MD;  Location: [place] CV LAB;  Service: Cardiovascular;;    Current Medications: Current Meds  Medication Sig  . carvedilol (COREG) 12.5 MG tablet Take 1 tablet (12.5 mg total) by mouth 2 (two) times daily with a meal.  . furosemide (LASIX) 40 MG tablet Take 1 tablet (40 mg total) by mouth daily.  Marland Kitchen losartan (COZAAR) 50 MG tablet TAKE 1 TABLET BY MOUTH TWICE DAILY.MAKE APPOINTMENT AT [phone]  . potassium chloride SA (K-DUR,KLOR-CON) 10 MEQ tablet Take 1 tablet (10 mEq total) by mouth daily.  Marland Kitchen spironolactone (ALDACTONE) 25 MG tablet TAKE 1 TABLET(25 MG) BY MOUTH DAILY.NEED APPOINTMENT FOR FUTURE REFILS PER MD     Allergies:   Penicillins   Social History   Socioeconomic History  . Marital status: Single    Spouse name: Not on file  . Number of children: Not  on file  . Years of education: Not on file  . Highest education level: Not on file  Occupational History  . Occupation: Unemployed  Social Needs  . Financial resource strain: Not on file  . Food insecurity    Worry: Not on file    Inability: Not on file  . Transportation needs    Medical: Not on file    Non-medical: Not on file  Tobacco Use  . Smoking status: Never Smoker  . Smokeless tobacco: Never Used  Substance and Sexual Activity  . Alcohol  use: Never    Frequency: Never  . Drug use: Never  . Sexual activity: Not on file  Lifestyle  . Physical activity    Days per week: Not on file    Minutes per session: Not on file  . Stress: Not on file  Relationships  . Social Musician on phone: Not on file    Gets together: Not on file    Attends religious service: Not on file    Active member of club or organization: Not on file    Attends meetings of clubs or organizations: Not on file    Relationship status: Not on file  Other Topics Concern  . Not on file  Social History Narrative   Moved back to [place] to live with his parents     Family History: The patient's family history includes Cancer in his mother; Coronary artery disease in his father.  ROS:   Please see the history of present illness.    All other systems reviewed and are negative.  EKGs/Labs/Other Studies Reviewed:    The following studies were reviewed today:  Echocardiogram 10/11/2017: Study Conclusions: - Left ventricle: The cavity size was severely dilated. Wall   thickness was increased in a pattern of mild LVH. Systolic   function was severely reduced. The estimated ejection fraction   was in the range of 20% to 25%. Dyskinesis of the   basal-midanteroseptal myocardium. Doppler parameters are   consistent with a reversible restrictive pattern, indicative of   decreased left ventricular diastolic compliance and/or increased   left atrial pressure (grade 3 diastolic dysfunction). - Mitral valve: There was mild regurgitation. - Left atrium: The atrium was moderately dilated. - Pulmonary arteries: Systolic pressure was moderately to severely   increased. PA peak pressure: 64 mm Hg (S). - Pericardium, extracardiac: A trivial pericardial effusion was   identified. _______________  Right/Left Cardiac Catheterization 10/13/2017:  Right dominant coronary anatomy.  Widely patent coronary arteries without evidence of obstructive  disease.  RV systolic pressure 36 mmHg.  Unable to manipulate the balloon tip catheter into the main pulmonary artery for recordings.  Severely dilated and globally hypocontractile LV with end-diastolic pressure of 19 mmHg.  Findings are compatible with acute on chronic combined systolic and diastolic heart failure.  Estimated ejection fraction 20%.  Recommendations:  Guideline directed therapy for systolic heart failure. _______________  Echocardiogram 02/24/2018: Study Conclusions: - Left ventricle: The cavity size was mildly dilated. Systolic   function was mildly to moderately reduced. The estimated ejection   fraction was in the range of 40% to 45%. Diffuse hypokinesis.   Features are consistent with a pseudonormal left ventricular   filling pattern, with concomitant abnormal relaxation and   increased filling pressure (grade 2 diastolic dysfunction). - Aortic valve: Trileaflet; mildly thickened, mildly calcified   leaflets. There was trivial regurgitation. - Mitral valve: Calcified annulus. Mildly thickened leaflets . - Left atrium: The atrium  was mildly dilated. - Right ventricle: The cavity size was mildly dilated. Wall   thickness was normal. - Pulmonary arteries: Systolic pressure was mildly increased. PA   peak pressure: 35 mm Hg (S).  Impressions: - EF has improved when compared to prior.  EKG:  EKG not ordered today.   Recent Labs: No results found for requested labs within last 8760 hours.  Recent Lipid Panel    Component Value Date/Time   CHOL 121 10/11/2017 0226   TRIG 72 10/11/2017 0226   HDL 29 (L) 10/11/2017 0226   CHOLHDL 4.2 10/11/2017 0226   VLDL 14 10/11/2017 0226   LDLCALC 78 10/11/2017 0226    Physical Exam:    Vital Signs: BP 130/84   Pulse 85   Ht 5\' 6"  (1.676 m)   Wt 235 lb (106.6 kg)   SpO2 96%   BMI 37.93 kg/m     Wt Readings from Last 3 Encounters:  05/15/19 235 lb (106.6 kg)  04/24/19 238 lb 6.4 oz (108.1 kg)  02/24/18 222 lb  (100.7 kg)     General: 43 y.o. obese Caucasian male resting comfortably in no acute distress. HEENT: Normocephalic and atraumatic. Sclera clear. EOMs intact. Neck: Supple. No carotid bruits. No JVD. Heart: RRR. Distinct S1 and S2. No murmurs, gallops, or rubs. Radial and distal pedal pulses 2+ and equal bilaterally.  Lungs: No increased work of breathing. Mild rhonchi at bilateral bases but lungs otherwise clear to ausculation. No wheezes or rales.  Abdomen: Soft, non-distended, and non-tender to palpation. Bowel sounds present. MSK: Normal strength and tone for age. Extremities: No clubbing, cyanosis, or edema.    Skin: Warm and dry. Neuro: Alert and oriented x3. No focal deficits. Psych: Normal affect. Responds appropriately.   Assessment:    1. Chronic combined systolic and diastolic CHF (congestive heart failure) (HCC)   2. Non-ischemic cardiomyopathy (HCC)   3. Essential hypertension   4. LBBB (left bundle branch block)     Plan:    Chronic Combined CHF/ Nonischemic Cardiomyopathy  - LVEF of 40-45% on Echo in 02/2018. - Mild rhonchi at bases but patient denies any respiratory or CHF symptoms. Appears euvolemic on exam.  - Heart rate better controlled on increased dose of Coreg. - Continue current medications: Lasix 40mg  daily, Losartan 50mg  daily, Coreg 12.5mg  twice daily, and Spironolactone 25mg  daily. - Will recheck the BMET today to ensure renal function electrolytes are stable.  Hypertension - BP well controlled at 130/84 - Continue current medications.  LBBB - Normal coronaries on cardiac cath in 09/2017. - Not a candidate for CRT-D since EF has improved.   Disposition: Follow up in 6 months with Dr. Mayford Knife or APP.   Medication Adjustments/Labs and Tests Ordered: Current medicines are reviewed at length with the patient today.  Concerns regarding medicines are outlined above.  Orders Placed This Encounter  Procedures  . Basic Metabolic Panel (BMET)   No  orders of the defined types were placed in this encounter.   Patient Instructions  Medication Instructions:  Your physician recommends that you continue on your current medications as directed. Please refer to the Current Medication list given to you today.  *If you need a refill on your cardiac medications before your next appointment, please call your pharmacy*  Lab Work: TODAY: BMET  If you have labs (blood work) drawn today and your tests are completely normal, you will receive your results only by: Marland Kitchen MyChart Message (if you have MyChart) OR . A paper  copy in the mail If you have any lab test that is abnormal or we need to change your treatment, we will call you to review the results.  Testing/Procedures: NONE  Follow-Up: At [facility], you and your health needs are our priority.  As part of our continuing mission to provide you with exceptional heart care, we have created designated Provider Care Teams.  These Care Teams include your primary Cardiologist (physician) and Advanced Practice Providers (APPs -  Physician Assistants and Nurse Practitioners) who all work together to provide you with the care you need, when you need it.  Your next appointment:   6 month(s)  The format for your next appointment:   Either In Person or Virtual  Provider:   Dr. Mayford Knifeurner       Signed, Corrin Parkerallie E Latriece Anstine, PA-C  05/15/2019 2:46 PM    [facility]

## 2019-05-15 ENCOUNTER — Ambulatory Visit: Payer: Medicaid Other | Admitting: Student

## 2019-05-15 ENCOUNTER — Other Ambulatory Visit: Payer: Self-pay

## 2019-05-15 ENCOUNTER — Other Ambulatory Visit: Payer: Self-pay | Admitting: Student

## 2019-05-15 ENCOUNTER — Encounter: Payer: Self-pay | Admitting: Physician Assistant

## 2019-05-15 ENCOUNTER — Other Ambulatory Visit (HOSPITAL_COMMUNITY): Payer: Self-pay

## 2019-05-15 VITALS — BP 130/84 | HR 85 | Ht 66.0 in | Wt 235.0 lb

## 2019-05-15 DIAGNOSIS — I10 Essential (primary) hypertension: Secondary | ICD-10-CM

## 2019-05-15 DIAGNOSIS — I447 Left bundle-branch block, unspecified: Secondary | ICD-10-CM | POA: Diagnosis not present

## 2019-05-15 DIAGNOSIS — I5042 Chronic combined systolic (congestive) and diastolic (congestive) heart failure: Secondary | ICD-10-CM

## 2019-05-15 DIAGNOSIS — I428 Other cardiomyopathies: Secondary | ICD-10-CM

## 2019-05-15 MED ORDER — SPIRONOLACTONE 25 MG PO TABS: ORAL_TABLET | ORAL | 1 refills | Status: DC

## 2019-05-15 NOTE — Patient Instructions (Signed)
Medication Instructions:  Your physician recommends that you continue on your current medications as directed. Please refer to the Current Medication list given to you today.  *If you need a refill on your cardiac medications before your next appointment, please call your pharmacy*  Lab Work: TODAY: BMET  If you have labs (blood work) drawn today and your tests are completely normal, you will receive your results only by: Marland Kitchen MyChart Message (if you have MyChart) OR . A paper copy in the mail If you have any lab test that is abnormal or we need to change your treatment, we will call you to review the results.  Testing/Procedures: NONE  Follow-Up: At [facility], you and your health needs are our priority.  As part of our continuing mission to provide you with exceptional heart care, we have created designated Provider Care Teams.  These Care Teams include your primary Cardiologist (physician) and Advanced Practice Providers (APPs -  Physician Assistants and Nurse Practitioners) who all work together to provide you with the care you need, when you need it.  Your next appointment:   6 month(s)  The format for your next appointment:   Either In Person or Virtual  Provider:   Dr. Radford Pax

## 2019-05-16 ENCOUNTER — Other Ambulatory Visit (HOSPITAL_COMMUNITY): Payer: Self-pay

## 2019-05-16 LAB — BASIC METABOLIC PANEL
BUN/Creatinine Ratio: 20 (ref 9–20)
BUN: 20 mg/dL (ref 6–24)
CO2: 25 mmol/L (ref 20–29)
Calcium: 9.3 mg/dL (ref 8.7–10.2)
Chloride: 98 mmol/L (ref 96–106)
Creatinine, Ser: 0.99 mg/dL (ref 0.76–1.27)
GFR calc Af Amer: 109 mL/min/{1.73_m2} (ref >59)
GFR calc non Af Amer: 94 mL/min/{1.73_m2} (ref >59)
Glucose: 129 mg/dL — ABNORMAL HIGH (ref 65–99)
Potassium: 4.2 mmol/L (ref 3.5–5.2)
Sodium: 137 mmol/L (ref 134–144)

## 2019-05-16 MED ORDER — SPIRONOLACTONE 25 MG PO TABS: ORAL_TABLET | ORAL | 1 refills | Status: DC

## 2019-07-22 ENCOUNTER — Other Ambulatory Visit: Payer: Self-pay | Admitting: Cardiology

## 2019-07-31 ENCOUNTER — Other Ambulatory Visit (HOSPITAL_COMMUNITY): Payer: Self-pay

## 2019-07-31 ENCOUNTER — Other Ambulatory Visit: Payer: Self-pay

## 2019-07-31 MED ORDER — POTASSIUM CHLORIDE CRYS ER 10 MEQ PO TBCR: 10.0000 meq | EXTENDED_RELEASE_TABLET | Freq: Every day | ORAL | 2 refills | Status: DC

## 2019-07-31 MED ORDER — POTASSIUM CHLORIDE CRYS ER 10 MEQ PO TBCR: 10.0000 meq | EXTENDED_RELEASE_TABLET | Freq: Every day | ORAL | 0 refills | Status: DC

## 2019-08-02 ENCOUNTER — Other Ambulatory Visit: Payer: Self-pay | Admitting: Cardiology

## 2019-08-02 MED ORDER — POTASSIUM CHLORIDE CRYS ER 10 MEQ PO TBCR: 10.0000 meq | EXTENDED_RELEASE_TABLET | Freq: Every day | ORAL | 2 refills | Status: DC

## 2019-08-25 ENCOUNTER — Other Ambulatory Visit (HOSPITAL_COMMUNITY): Payer: Self-pay

## 2019-10-16 ENCOUNTER — Other Ambulatory Visit (HOSPITAL_COMMUNITY): Payer: Self-pay

## 2019-10-20 ENCOUNTER — Other Ambulatory Visit: Payer: Self-pay

## 2019-10-20 ENCOUNTER — Other Ambulatory Visit (HOSPITAL_COMMUNITY): Payer: Self-pay

## 2019-10-20 NOTE — Telephone Encounter (Signed)
This is a CHF pt 

## 2019-10-23 ENCOUNTER — Other Ambulatory Visit: Payer: Self-pay

## 2019-10-23 MED ORDER — SPIRONOLACTONE 25 MG PO TABS: ORAL_TABLET | ORAL | 6 refills | Status: DC

## 2019-10-23 NOTE — Telephone Encounter (Signed)
Pt's medication was sent to pt's pharmacy as requested. Confirmation received.  °

## 2020-02-20 ENCOUNTER — Encounter (HOSPITAL_COMMUNITY): Payer: Self-pay

## 2020-02-20 ENCOUNTER — Emergency Department (HOSPITAL_COMMUNITY): Payer: Medicaid Other

## 2020-02-20 ENCOUNTER — Other Ambulatory Visit: Payer: Self-pay

## 2020-02-20 ENCOUNTER — Emergency Department (HOSPITAL_COMMUNITY)
Admission: EM | Admit: 2020-02-20 | Discharge: 2020-02-20 | Disposition: A | Discharge status: Home / Self Care | Payer: Medicaid Other | Attending: Emergency Medicine | Admitting: Emergency Medicine

## 2020-02-20 DIAGNOSIS — R509 Fever, unspecified: Secondary | ICD-10-CM | POA: Diagnosis present

## 2020-02-20 DIAGNOSIS — Z79899 Other long term (current) drug therapy: Secondary | ICD-10-CM | POA: Insufficient documentation

## 2020-02-20 DIAGNOSIS — I5041 Acute combined systolic (congestive) and diastolic (congestive) heart failure: Secondary | ICD-10-CM | POA: Diagnosis not present

## 2020-02-20 DIAGNOSIS — U071 COVID-19: Secondary | ICD-10-CM | POA: Insufficient documentation

## 2020-02-20 DIAGNOSIS — I110 Hypertensive heart disease with heart failure: Secondary | ICD-10-CM | POA: Diagnosis not present

## 2020-02-20 DIAGNOSIS — Z20822 Contact with and (suspected) exposure to covid-19: Secondary | ICD-10-CM | POA: Diagnosis not present

## 2020-02-20 LAB — CBC WITH DIFFERENTIAL/PLATELET
Abs Immature Granulocytes: 0.01 10*3/uL (ref 0.00–0.07)
Basophils Absolute: 0 10*3/uL (ref 0.0–0.1)
Basophils Relative: 0 %
Eosinophils Absolute: 0 10*3/uL (ref 0.0–0.5)
Eosinophils Relative: 0 %
HCT: 42.5 % (ref 39.0–52.0)
Hemoglobin: 14.6 g/dL (ref 13.0–17.0)
Immature Granulocytes: 0 %
Lymphocytes Relative: 17 %
Lymphs Abs: 1 10*3/uL (ref 0.7–4.0)
MCH: 31.3 pg (ref 26.0–34.0)
MCHC: 34.4 g/dL (ref 30.0–36.0)
MCV: 91 fL (ref 80.0–100.0)
Monocytes Absolute: 0.4 10*3/uL (ref 0.1–1.0)
Monocytes Relative: 7 %
Neutro Abs: 4.2 10*3/uL (ref 1.7–7.7)
Neutrophils Relative %: 76 %
Platelets: 164 10*3/uL (ref 150–400)
RBC: 4.67 MIL/uL (ref 4.22–5.81)
RDW: 12.3 % (ref 11.5–15.5)
WBC: 5.6 10*3/uL (ref 4.0–10.5)
nRBC: 0 % (ref 0.0–0.2)

## 2020-02-20 LAB — COMPREHENSIVE METABOLIC PANEL
ALT: 32 U/L (ref 0–44)
AST: 39 U/L (ref 15–41)
Albumin: 3.8 g/dL (ref 3.5–5.0)
Alkaline Phosphatase: 64 U/L (ref 38–126)
Anion gap: 12 (ref 5–15)
BUN: 19 mg/dL (ref 6–20)
CO2: 22 mmol/L (ref 22–32)
Calcium: 8 mg/dL — ABNORMAL LOW (ref 8.9–10.3)
Chloride: 100 mmol/L (ref 98–111)
Creatinine, Ser: 1.19 mg/dL (ref 0.61–1.24)
GFR calc Af Amer: >60 mL/min (ref >60)
GFR calc non Af Amer: >60 mL/min (ref >60)
Glucose, Bld: 182 mg/dL — ABNORMAL HIGH (ref 70–99)
Potassium: 3.8 mmol/L (ref 3.5–5.1)
Sodium: 134 mmol/L — ABNORMAL LOW (ref 135–145)
Total Bilirubin: 0.8 mg/dL (ref 0.3–1.2)
Total Protein: 7.6 g/dL (ref 6.5–8.1)

## 2020-02-20 LAB — SARS CORONAVIRUS 2 BY RT PCR (HOSPITAL ORDER, PERFORMED IN ~~LOC~~ HOSPITAL LAB): SARS Coronavirus 2: POSITIVE — AB

## 2020-02-20 LAB — BRAIN NATRIURETIC PEPTIDE: B Natriuretic Peptide: 27.9 pg/mL (ref 0.0–100.0)

## 2020-02-20 MED ORDER — SODIUM CHLORIDE 0.9 % IV BOLUS: 1000.0000 mL | Freq: Once | INTRAVENOUS | Status: DC

## 2020-02-20 MED ORDER — ACETAMINOPHEN 325 MG PO TABS
650.0000 mg | ORAL_TABLET | Freq: Once | ORAL | Status: AC | PRN
  Administered 2020-02-20: 650 mg via ORAL
  Filled 2020-02-20: qty 2

## 2020-02-20 MED ORDER — KETOROLAC TROMETHAMINE 30 MG/ML IJ SOLN
15.0000 mg | Freq: Once | INTRAMUSCULAR | Status: AC
  Administered 2020-02-20: 15 mg via INTRAMUSCULAR

## 2020-02-20 MED ORDER — KETOROLAC TROMETHAMINE 30 MG/ML IJ SOLN
15.0000 mg | Freq: Once | INTRAMUSCULAR | Status: DC
  Filled 2020-02-20: qty 1

## 2020-02-20 NOTE — Discharge Instructions (Signed)
As discussed, you have been diagnosed with COVID-19.  You will be contacted to follow-up in our infusion clinic if you are a candidate to benefit from special therapy.   Please follow-up with your physician.

## 2020-02-20 NOTE — ED Provider Notes (Signed)
[facility]-EMERGENCY DEPT Provider Note   CSN: [id number] Arrival date & time: 02/20/20  1626     History Chief Complaint  Patient presents with  . Fever  . Cough  . Fatigue    Mathews Stuhr is a 44 y.o. male.  HPI    Patient presents with fever, cough, dyspnea. Patient is unvaccinated. He notes that his illness began about 3 days ago. He states he is generally well, though he does have a history of substantial disease, including CHF, hypertension.  He takes his medication regularly and had no issues until this illness. He notes that just prior to the onset of illness he was traveling through [place] and [place] with his father. His father is also ill. Since onset no relief with OTC medication.  No focal pain, though there is diffuse myalgia.  Past Medical History:  Diagnosis Date  . Chronic combined systolic and diastolic CHF (congestive heart failure) (HCC)   . Hypertension   . LBBB (left bundle branch block)   . NICM (nonischemic cardiomyopathy) (HCC)   . Pericardial effusion    a. trivial by echo 09/2017.    Patient Active Problem List   Diagnosis Date Noted  . DCM (dilated cardiomyopathy) (HCC)   . Hypokalemia   . Acute combined systolic and diastolic congestive heart failure (HCC) 10/11/2017  . Hypertensive urgency 10/11/2017  . Acute congestive heart failure (HCC)   . LBBB (left bundle branch block)     Past Surgical History:  Procedure Laterality Date  . NEUROBLASTOMA EXCISION     Had prior to 44 year of age, abdominal  . RIGHT/LEFT HEART CATH AND CORONARY ANGIOGRAPHY N/A 10/13/2017   Procedure: RIGHT/LEFT HEART CATH AND CORONARY ANGIOGRAPHY;  Surgeon: Lyn Records, MD;  [facility] LAB;  Service: Cardiovascular;  Laterality: N/A;  . ULTRASOUND GUIDANCE FOR VASCULAR ACCESS  10/13/2017   Procedure: Ultrasound Guidance For Vascular Access;  Surgeon: Lyn Records, MD;  Location: [facility] INVASIVE CV LAB;  Service:  Cardiovascular;;       Family History  Problem Relation Age of Onset  . Cancer Mother   . Coronary artery disease Father        had a stent approx 2015    Social History   Tobacco Use  . Smoking status: Never Smoker  . Smokeless tobacco: Never Used  Vaping Use  . Vaping Use: Never used  Substance Use Topics  . Alcohol use: Never  . Drug use: Never    Home Medications Prior to Admission medications   Medication Sig Start Date End Date Taking? Authorizing Provider  carvedilol (COREG) 12.5 MG tablet Take 1 tablet (12.5 mg total) by mouth 2 (two) times daily with a meal. 04/24/19 04/18/20  Turner, Cornelious Bryant, MD  furosemide (LASIX) 40 MG tablet Take 1 tablet (40 mg total) by mouth daily. 04/28/19   Quintella Reichert, MD  losartan (COZAAR) 50 MG tablet TAKE 1 TABLET BY MOUTH TWICE DAILY.MAKE APPOINTMENT AT [phone] 11/08/18   Bensimhon, Bevelyn Buckles, MD  potassium chloride (KLOR-CON) 10 MEQ tablet Take 1 tablet (10 mEq total) by mouth daily. 08/02/19   Quintella Reichert, MD  spironolactone (ALDACTONE) 25 MG tablet TAKE 1 TABLET(25 MG) BY MOUTH DAILY. 10/23/19   Quintella Reichert, MD    Allergies    Penicillins  Review of Systems   Review of Systems  Constitutional:       Per HPI, otherwise negative  HENT:  Per HPI, otherwise negative  Respiratory:       Per HPI, otherwise negative  Cardiovascular:       Per HPI, otherwise negative  Gastrointestinal: Negative for vomiting.  Endocrine:       Negative aside from HPI  Genitourinary:       Neg aside from HPI   Musculoskeletal:       Per HPI, otherwise negative  Skin: Negative.   Allergic/Immunologic: Negative for immunocompromised state.  Neurological: Negative for syncope.    Physical Exam Updated Vital Signs BP (!) 138/96   Pulse 84   Temp 98.1 F (36.7 C) (Oral)   Resp 16   Ht 5\' 6"  (1.676 m)   Wt 106.6 kg   SpO2 95%   BMI 37.93 kg/m   Physical Exam Vitals and nursing note reviewed.  Constitutional:       General: He is not in acute distress.    Appearance: He is well-developed.  HENT:     Head: Normocephalic and atraumatic.  Eyes:     Conjunctiva/sclera: Conjunctivae normal.  Cardiovascular:     Rate and Rhythm: Normal rate and regular rhythm.  Pulmonary:     Effort: Pulmonary effort is normal. No respiratory distress.     Breath sounds: No stridor.  Abdominal:     General: There is no distension.  Skin:    General: Skin is warm and dry.  Neurological:     Mental Status: He is alert and oriented to person, place, and time.     ED Results / Procedures / Treatments   Labs (all labs ordered are listed, but only abnormal results are displayed) Labs Reviewed  SARS CORONAVIRUS 2 BY RT PCR (HOSPITAL ORDER, PERFORMED IN [facility]) - Abnormal; Notable for the following components:      Result Value   SARS Coronavirus 2 POSITIVE (*)    All other components within normal limits  COMPREHENSIVE METABOLIC PANEL - Abnormal; Notable for the following components:   Sodium 134 (*)    Glucose, Bld 182 (*)    Calcium 8.0 (*)    All other components within normal limits  CBC WITH DIFFERENTIAL/PLATELET  BRAIN NATRIURETIC PEPTIDE    Radiology DG Chest Port 1 View  Result Date: 02/20/2020 CLINICAL DATA:  Fever cough and fatigue EXAM: PORTABLE CHEST 1 VIEW COMPARISON:  10/11/2017 FINDINGS: Low lung volumes. Patchy lower lung airspace opacities. Mild cardiomegaly with vascular congestion. No pleural effusion or pneumothorax. IMPRESSION: Patchy airspace opacities in the lower lung zones suspicious for pneumonia, possibly atypical or viral pneumonia. Mild cardiomegaly with vascular congestion. Electronically Signed   By: 10/13/2017 M.D.   On: 02/20/2020 19:20    Procedures Procedures (including critical care time)  Medications Ordered in ED Medications  acetaminophen (TYLENOL) tablet 650 mg (650 mg Oral Given 02/20/20 1659)  ketorolac (TORADOL) 30 MG/ML injection 15 mg (15 mg  Intramuscular Given 02/20/20 1831)    ED Course  I have reviewed the triage vital signs and the nursing notes.  Pertinent labs & imaging results that were available during my care of the patient were reviewed by me and considered in my medical decision making (see chart for details).  On repeat exam the patient is sitting upright, speaking clearly, in no distress.  Line discussed today's evaluation was notable for positive coronavirus result. Patient now states that he was ill may be prior to initially reported onset, possibly longer than 1 week ago. We discussed implications of Covid positive result,  abnormal x-ray, need for home monitoring, Tylenol, ibuprofen, fluids, outpatient follow-up. Patient discharged in stable condition.   Kordell Jafri was evaluated in Emergency Department on 02/20/2020 for the symptoms described in the history of present illness. He was evaluated in the context of the global COVID-19 pandemic, which necessitated consideration that the patient might be at risk for infection with the SARS-CoV-2 virus that causes COVID-19. Institutional protocols and algorithms that pertain to the evaluation of patients at risk for COVID-19 are in a state of rapid change based on information released by regulatory bodies including the CDC and federal and state organizations. These policies and algorithms were followed during the patient's care in the ED.  Final Clinical Impression(s) / ED Diagnoses Final diagnoses:  COVID-19    Rx / DC Orders ED Discharge Orders    None       Gerhard Munch, MD 02/20/20 2114

## 2020-02-20 NOTE — ED Triage Notes (Signed)
Patient went on a 3 state trip recently with his father. Patient c/o fever, cough, and fatigue.

## 2020-02-21 ENCOUNTER — Telehealth: Payer: Self-pay | Admitting: Oncology

## 2020-02-21 NOTE — Telephone Encounter (Signed)
Called to Discuss with patient about Covid symptoms and the use of regeneron, a monoclonal antibody infusion for those with mild to moderate Covid symptoms and at a high risk of hospitalization.     Pt is qualified for this infusion at the  infusion center due to co-morbid conditions and/or a member of an at-risk group.     Unable to reach pt. Left message to return call.   Past Medical History:  Diagnosis Date  . Chronic combined systolic and diastolic CHF (congestive heart failure) (HCC)   . Hypertension   . LBBB (left bundle branch block)   . NICM (nonischemic cardiomyopathy) (HCC)   . Pericardial effusion    a. trivial by echo 09/2017.   Mignon Pine AGNP-C [phone] (Infusion Center Hotline)

## 2020-05-01 ENCOUNTER — Ambulatory Visit: Payer: Medicaid Other | Admitting: Cardiology

## 2020-05-08 ENCOUNTER — Other Ambulatory Visit: Payer: Self-pay | Admitting: Cardiology

## 2020-05-29 ENCOUNTER — Other Ambulatory Visit: Payer: Self-pay

## 2020-05-29 ENCOUNTER — Telehealth: Payer: Self-pay | Admitting: *Deleted

## 2020-05-29 ENCOUNTER — Encounter: Payer: Self-pay | Admitting: Cardiology

## 2020-05-29 ENCOUNTER — Encounter (INDEPENDENT_AMBULATORY_CARE_PROVIDER_SITE_OTHER): Payer: Self-pay

## 2020-05-29 ENCOUNTER — Ambulatory Visit: Payer: Medicaid Other | Admitting: Cardiology

## 2020-05-29 VITALS — BP 122/90 | HR 90 | Ht 66.0 in | Wt 233.4 lb

## 2020-05-29 DIAGNOSIS — I5022 Chronic systolic (congestive) heart failure: Secondary | ICD-10-CM | POA: Diagnosis not present

## 2020-05-29 DIAGNOSIS — G4719 Other hypersomnia: Secondary | ICD-10-CM

## 2020-05-29 DIAGNOSIS — I447 Left bundle-branch block, unspecified: Secondary | ICD-10-CM | POA: Diagnosis not present

## 2020-05-29 DIAGNOSIS — I10 Essential (primary) hypertension: Secondary | ICD-10-CM

## 2020-05-29 DIAGNOSIS — I420 Dilated cardiomyopathy: Secondary | ICD-10-CM

## 2020-05-29 NOTE — Addendum Note (Signed)
Addended by: Theresia Majors on: 05/29/2020 04:03 PM   Modules accepted: Orders

## 2020-05-29 NOTE — Telephone Encounter (Signed)
-----   Message from Theresia Majors, RN sent at 05/29/2020  4:06 PM EST ----- PSG sleep study has been ordered for heart failure and excessive daytime sleepiness.  Thanks! Carly

## 2020-05-29 NOTE — Patient Instructions (Signed)
Medication Instructions:  Your physician recommends that you continue on your current medications as directed. Please refer to the Current Medication list given to you today.  *If you need a refill on your cardiac medications before your next appointment, please call your pharmacy*  Testing/Procedures: Your physician has requested that you have an echocardiogram. Echocardiography is a painless test that uses sound waves to create images of your heart. It provides your doctor with information about the size and shape of your heart and how well your heart's chambers and valves are working. This procedure takes approximately one hour. There are no restrictions for this procedure.  Your physician has recommended that you have a sleep study. This test records several body functions during sleep, including: brain activity, eye movement, oxygen and carbon dioxide blood levels, heart rate and rhythm, breathing rate and rhythm, the flow of air through your mouth and nose, snoring, body muscle movements, and chest and belly movement.  Follow-Up: At [facility], you and your health needs are our priority.  As part of our continuing mission to provide you with exceptional heart care, we have created designated Provider Care Teams.  These Care Teams include your primary Cardiologist (physician) and Advanced Practice Providers (APPs -  Physician Assistants and Nurse Practitioners) who all work together to provide you with the care you need, when you need it.  Your next appointment:   1 year(s)  The format for your next appointment:   In Person  Provider:   You may see Armanda Magic, MD or one of the following Advanced Practice Providers on your designated Care Team:    Ronie Spies, PA-C  Jacolyn Reedy, PA-C

## 2020-05-29 NOTE — Progress Notes (Signed)
Cardiology Office Note:    Date:  05/29/2020   ID:  John Olsen, DOB [birth year], MRN [id number]  PCP:  Shanna Cisco, NP  Cardiologist:  Armanda Magic, MD    Referring MD: Shanna Cisco, NP   Chief Complaint  Patient presents with  . Congestive Heart Failure  . Hypertension    History of Present Illness:    John Olsen is a 44 y.o. male with a hx of HTN, obesity, LBBB, and chronic systolic HF (diagnosed 09/2017).  Admitted 4/22-4/25/2019 and diagnosed with systolic HF. Echo showed EF 20-25%. LHC with normal coronaries.  He has been followed by AHF service and recently discharged from their care.  He has a wide LBBB on his EKG. No ETOH use. No family hx of HF.  He was intolerant to [facility] and has been on Losartan, carvedilol and Cleda Daub.  His last EF assessment showed an EF of 40-45% 02/2018.    He is here today for followup and is doing well.  he denies any chest pain or pressure, SOB, DOE, PND, orthopnea, LE edema, dizziness, palpitations or syncope. He is compliant with his meds and is tolerating meds with no SE.    Past Medical History:  Diagnosis Date  . Chronic combined systolic and diastolic CHF (congestive heart failure) (HCC)   . Hypertension   . LBBB (left bundle branch block)   . NICM (nonischemic cardiomyopathy) (HCC)   . Pericardial effusion    a. trivial by echo 09/2017.    Past Surgical History:  Procedure Laterality Date  . NEUROBLASTOMA EXCISION     Had prior to 44 year of age, abdominal  . RIGHT/LEFT HEART CATH AND CORONARY ANGIOGRAPHY N/A 10/13/2017   Procedure: RIGHT/LEFT HEART CATH AND CORONARY ANGIOGRAPHY;  Surgeon: Lyn Records, MD;  Location: [facility] INVASIVE CV LAB;  Service: Cardiovascular;  Laterality: N/A;  . ULTRASOUND GUIDANCE FOR VASCULAR ACCESS  10/13/2017   Procedure: Ultrasound Guidance For Vascular Access;  Surgeon: Lyn Records, MD;  Location: [facility] INVASIVE CV LAB;  Service: Cardiovascular;;    Current Medications: Current Meds   Medication Sig  . carvedilol (COREG) 12.5 MG tablet Take 1 tablet (12.5 mg total) by mouth 2 (two) times daily with a meal.  . furosemide (LASIX) 40 MG tablet Take 1 tablet (40 mg total) by mouth daily. Pt must keep upcoming appt in Dec for further refills  . losartan (COZAAR) 50 MG tablet TAKE 1 TABLET BY MOUTH TWICE DAILY.MAKE APPOINTMENT AT [phone]  . potassium chloride (KLOR-CON) 10 MEQ tablet Take 1 tablet (10 mEq total) by mouth daily.  Marland Kitchen spironolactone (ALDACTONE) 25 MG tablet TAKE 1 TABLET(25 MG) BY MOUTH DAILY.  Marland Kitchen Vitamin D, Ergocalciferol, (DRISDOL) 1.25 MG (50000 UNIT) CAPS capsule Take 50,000 Units by mouth once a week.     Allergies:   Penicillins   Social History   Socioeconomic History  . Marital status: Single    Spouse name: Not on file  . Number of children: Not on file  . Years of education: Not on file  . Highest education level: Not on file  Occupational History  . Occupation: Unemployed  Tobacco Use  . Smoking status: Never Smoker  . Smokeless tobacco: Never Used  Vaping Use  . Vaping Use: Never used  Substance and Sexual Activity  . Alcohol use: Never  . Drug use: Never  . Sexual activity: Not on file  Other Topics Concern  . Not on file  Social History Narrative  Moved back to [place] to live with his parents   Social Determinants of Health   Financial Resource Strain:   . Difficulty of Paying Living Expenses: Not on file  Food Insecurity:   . Worried About Programme researcher, broadcasting/film/video in the Last Year: Not on file  . Ran Out of Food in the Last Year: Not on file  Transportation Needs:   . Lack of Transportation (Medical): Not on file  . Lack of Transportation (Non-Medical): Not on file  Physical Activity:   . Days of Exercise per Week: Not on file  . Minutes of Exercise per Session: Not on file  Stress:   . Feeling of Stress : Not on file  Social Connections:   . Frequency of Communication with Friends and Family: Not on file  . Frequency  of Social Gatherings with Friends and Family: Not on file  . Attends Religious Services: Not on file  . Active Member of Clubs or Organizations: Not on file  . Attends Banker Meetings: Not on file  . Marital Status: Not on file     Family History: The patient's family history includes Cancer in his mother; Coronary artery disease in his father.  ROS:   Please see the history of present illness.    ROS  All other systems reviewed and negative.   EKGs/Labs/Other Studies Reviewed:    The following studies were reviewed today: 2D echo 2019  EKG:  EKG is  ordered today.  The ekg ordered today demonstrates NSR with LBBB  Recent Labs: 02/20/2020: ALT 32; B Natriuretic Peptide 27.9; BUN 19; Creatinine, Ser 1.19; Hemoglobin 14.6; Platelets 164; Potassium 3.8; Sodium 134   Recent Lipid Panel    Component Value Date/Time   CHOL 121 10/11/2017 0226   TRIG 72 10/11/2017 0226   HDL 29 (L) 10/11/2017 0226   CHOLHDL 4.2 10/11/2017 0226   VLDL 14 10/11/2017 0226   LDLCALC 78 10/11/2017 0226    Physical Exam:    VS:  BP 122/90   Pulse 90   Ht 5\' 6"  (1.676 m)   Wt 233 lb 6.4 oz (105.9 kg)   SpO2 95%   BMI 37.67 kg/m     Wt Readings from Last 3 Encounters:  05/29/20 233 lb 6.4 oz (105.9 kg)  02/20/20 235 lb (106.6 kg)  05/15/19 235 lb (106.6 kg)     GEN: Well nourished, well developed in no acute distress HEENT: Normal NECK: No JVD; No carotid bruits LYMPHATICS: No lymphadenopathy CARDIAC:RRR, no murmurs, rubs, gallops RESPIRATORY:  Clear to auscultation without rales, wheezing or rhonchi  ABDOMEN: Soft, non-tender, non-distended MUSCULOSKELETAL:  No edema; No deformity  SKIN: Warm and dry NEUROLOGIC:  Alert and oriented x 3 PSYCHIATRIC:  Normal affect   EKG was performed in the office today and showed NSR with LBBB  ASSESSMENT:    1. Chronic systolic heart failure (HCC)   2. Essential hypertension   3. LBBB (left bundle branch block)    PLAN:    In  order of problems listed above:  1.  Chronic systolic CHF -NYHA class I -he appears euvolemic on exam today -I instructed him to continue on  Lasix 40mg  daily, Spiro 25mg  daily, Losartan 50mg  BID and carvedilol 12.5mg  BID -SCr stable at 1.19 and K+ 3.8 in Aug 2021 -I will get a PSG to rule out OSA as a cause of DCM especially since he feels tired in the am and is obes  2.  HTN -BP well  controlled on exam today -continue BB, ARB and spiro  3.  LBBB -normal coronary arteries on cath -not candidate for CRT-D since EF has improved to 40-45 % on echo 2019 -repeat echo to make sure LVF remains stable    Medication Adjustments/Labs and Tests Ordered: Current medicines are reviewed at length with the patient today.  Concerns regarding medicines are outlined above.  Orders Placed This Encounter  Procedures  . EKG 12-Lead   No orders of the defined types were placed in this encounter.   Signed, Armanda Magic, MD  05/29/2020 3:47 PM    [facility]

## 2020-06-04 NOTE — Telephone Encounter (Signed)
Tracking # [id number]

## 2020-06-10 NOTE — Telephone Encounter (Signed)
APPROVED; REF # [id number] FROM 06/22/20 - 09/20/20

## 2020-07-01 ENCOUNTER — Ambulatory Visit (HOSPITAL_COMMUNITY): Payer: Medicaid Other | Attending: Internal Medicine

## 2020-07-01 ENCOUNTER — Other Ambulatory Visit: Payer: Self-pay

## 2020-07-01 ENCOUNTER — Other Ambulatory Visit: Payer: Self-pay | Admitting: Internal Medicine

## 2020-07-01 DIAGNOSIS — I420 Dilated cardiomyopathy: Secondary | ICD-10-CM | POA: Insufficient documentation

## 2020-07-01 DIAGNOSIS — I5022 Chronic systolic (congestive) heart failure: Secondary | ICD-10-CM | POA: Insufficient documentation

## 2020-07-01 LAB — ECHOCARDIOGRAM COMPLETE
Area-P 1/2: 4.68 cm2
S' Lateral: 4.2 cm

## 2020-07-05 ENCOUNTER — Telehealth: Payer: Self-pay

## 2020-07-05 DIAGNOSIS — I517 Cardiomegaly: Secondary | ICD-10-CM

## 2020-07-05 DIAGNOSIS — I428 Other cardiomyopathies: Secondary | ICD-10-CM

## 2020-07-05 DIAGNOSIS — I420 Dilated cardiomyopathy: Secondary | ICD-10-CM

## 2020-07-05 NOTE — Telephone Encounter (Signed)
-----   Message from Dossie Arbour, RN sent at 07/03/2020  5:28 PM EST ----- Quintella Reichert, MD   Ok continue on Losartan

## 2020-07-05 NOTE — Telephone Encounter (Signed)
John Olsen is returning John Olsen's call in regards to his Echo results. Please advise.

## 2020-07-05 NOTE — Telephone Encounter (Signed)
Called placed back to pt.  Pt will come one day next week for his lab work. Pt was given his echocardiogram results.  He is aware that we will order a Cardiac MRI.  Medication was not addressed at this time due to discrepancies with phone note below regarding changing from Losartan to [facility], so therefore, appt with Pharm D wasn't made.  Pt understands if Dr. Mayford Knife still want to move with a medication change, someone will call him back next week.  Pt was very grateful for the call back.

## 2020-07-05 NOTE — Telephone Encounter (Signed)
John Reichert, MD  07/03/2020 11:34 AM EST      2D echo showed mild LV dysfunction with severe thickened heart muscle. Unclear why heart muscle is so thick. I would like to rule out Fabry's disease so please order a leukocyte alpha galactosidase A level (it will be a send out blood lab test) and also a Cardiac MRI with gad. Change Losartan to Entresto 49-51mg  BID and followup in PharmD clinic in 2 weeks for uptitration of Entresto. BMET in 1 week.   Per Dr. Mayford Knife okay to continue on Losartan.    Left message for patient to call back.

## 2020-07-06 NOTE — Telephone Encounter (Signed)
See note under echo report >>  patient is intolerant to [employer]

## 2020-07-11 ENCOUNTER — Telehealth: Payer: Self-pay | Admitting: Cardiology

## 2020-07-11 NOTE — Telephone Encounter (Signed)
Spoke with patient regarding preferred weekdays and times for scheduling the cardiac MRI ordered by Dr. Mayford Knife.  Informed patient I will call with the appointment information as soon as we hear regarding the insurance prior authorization.

## 2020-07-12 ENCOUNTER — Encounter: Payer: Self-pay | Admitting: Cardiology

## 2020-07-12 NOTE — Telephone Encounter (Signed)
Spoke with patient regarding scheduled appointment 08/15/20 at 12:00 pm at [facility] for the Cardiac MRI ordered by Dr. Mayford Knife.  Arrival time is 11:30 am--1st floor admissions office.  Will mail information to patient and he voiced his understanding.  Patient is scheduled for lab work 08/12/20 at [facility],

## 2020-08-02 ENCOUNTER — Encounter (HOSPITAL_BASED_OUTPATIENT_CLINIC_OR_DEPARTMENT_OTHER): Payer: Medicaid Other | Admitting: Cardiology

## 2020-08-06 ENCOUNTER — Other Ambulatory Visit: Payer: Self-pay | Admitting: Cardiology

## 2020-08-12 ENCOUNTER — Other Ambulatory Visit: Payer: Medicaid Other

## 2020-08-12 ENCOUNTER — Other Ambulatory Visit: Payer: Self-pay

## 2020-08-13 ENCOUNTER — Telehealth (HOSPITAL_COMMUNITY): Payer: Self-pay | Admitting: *Deleted

## 2020-08-13 NOTE — Telephone Encounter (Signed)
Attempted to call patient regarding upcoming cardiac MRI appointment. Left message on voicemail with name and callback number  Ashlin Kreps RN Navigator Cardiac Imaging [facility] [phone] Office [phone] Cell  

## 2020-08-14 LAB — ALPHA GALACTOSIDASE: Alpha-Galactosidase activity: 122.4 nmol/hr/mg prt (ref >35.5)

## 2020-08-15 ENCOUNTER — Other Ambulatory Visit: Payer: Self-pay

## 2020-08-15 ENCOUNTER — Ambulatory Visit (HOSPITAL_COMMUNITY)
Admission: RE | Admit: 2020-08-15 | Discharge: 2020-08-15 | Disposition: A | Discharge status: Home / Self Care | Payer: Medicaid Other | Source: Ambulatory Visit | Attending: Cardiology | Admitting: Cardiology

## 2020-08-15 DIAGNOSIS — I517 Cardiomegaly: Secondary | ICD-10-CM

## 2020-08-15 MED ORDER — GADOBUTROL 1 MMOL/ML IV SOLN
10.0000 mL | Freq: Once | INTRAVENOUS | Status: AC | PRN
  Administered 2020-08-15: 10 mL via INTRAVENOUS

## 2020-08-19 ENCOUNTER — Telehealth: Payer: Self-pay | Admitting: Cardiology

## 2020-08-19 DIAGNOSIS — I10 Essential (primary) hypertension: Secondary | ICD-10-CM

## 2020-08-19 NOTE — Telephone Encounter (Signed)
Quintella Reichert, MD  08/16/2020 12:36 PM EST      Cardiac MRI showed moderate LV dysfunction with EF 34% with diffuse HK, mild to moderate LVH, normal RV, no evidence of infiltrative heart disease. He is intolerant of Entresto. Change Losartan to 100mg  daily. Followup with PharmD in 1 week and if BP stable on Losartan 100mg  daily then increase carvedilol if HR allow as well as spiro.    The patient has been notified of the result and verbalized understanding.  All questions (if any) were answered. , RN 08/19/2020 11:18 AM   Patient is currently taking losartan 50 mg twice daily.

## 2020-08-19 NOTE — Telephone Encounter (Signed)
New message:    Patient calling to get results. Please call patient. Patient requesting to speak with John Olsen

## 2020-08-22 ENCOUNTER — Other Ambulatory Visit: Payer: Self-pay

## 2020-08-22 ENCOUNTER — Encounter: Payer: Self-pay | Admitting: Pharmacist

## 2020-08-22 ENCOUNTER — Ambulatory Visit (INDEPENDENT_AMBULATORY_CARE_PROVIDER_SITE_OTHER): Payer: Medicaid Other | Admitting: Pharmacist

## 2020-08-22 VITALS — BP 142/91 | HR 84 | Wt 234.2 lb

## 2020-08-22 DIAGNOSIS — I5041 Acute combined systolic (congestive) and diastolic (congestive) heart failure: Secondary | ICD-10-CM | POA: Diagnosis not present

## 2020-08-22 DIAGNOSIS — Z131 Encounter for screening for diabetes mellitus: Secondary | ICD-10-CM | POA: Diagnosis not present

## 2020-08-22 DIAGNOSIS — I10 Essential (primary) hypertension: Secondary | ICD-10-CM

## 2020-08-22 MED ORDER — LOSARTAN POTASSIUM 50 MG PO TABS: 100.0000 mg | ORAL_TABLET | Freq: Every day | ORAL | 3 refills | Status: DC

## 2020-08-22 NOTE — Patient Instructions (Addendum)
It was nice meeting you today!  We would like your blood pressure to be less than 130/80  Increase your losartan to 100mg  once daily Continue spironolactone 25mg  daily Continue furosemide 40mg  daily Continue carvedilol 12.5mg  twice a day  Try to be physically active, at least 30 minutes a day at least 5 days a week  Try to work on your diet. Concentrate on vegetables, whole grains, healthy fats, and lean proteins  We will check your lab work today and I will call you tomorrow with your results  , PharmD, BCACP, CDCES, CPP [facility] [address] [place], [facility] [address] Phone: [phone]; Fax: [phone] 08/22/2020 4:35 PM

## 2020-08-22 NOTE — Progress Notes (Signed)
Patient ID: John Olsen                 DOB: 1975/06/27                      MRN: [id number]     HPI: John Olsen is a 45 y.o. male referred by Dr. Mayford Knife to HTN clinic. PMH is significant for CHF, HTN, and LBBB.  Last seen by Dr Mayford Knife who ordered a cardiac MRI which showed LVEF of 34% and losartan was increased to 100mg  daily.  Patient presents today in good spirits with his medications.  Did not realize he was supposed to be taking losartan 50 mg BID, had only been taking it in the morning.  Of note, many of his pill bottles had old dates on them which he reports is because [facility] fills many of his meds at the same time so he isn't sure which are the new ones sometimes.  He reports made him constipated.  He reports no signs of swelling and appears euvolemic.  No SOB, headache, chest pain, or dizziness.  Owns a blood pressure cuff but does not use it because he is not sure how. Bought a scale from [employer] but it involves a digital app so he does not use it.  Last PCP visit last August.   Lives with his father and his father purchases the food.  Is not working.  He reports he is physically active by walking and stretching.   Denies alcohol and tobacco   Current HTN meds: losartan 50mg  (prescribed 100mg ), spironolactone 25mg  daily, carvedilol 12.5mg  BID, furosemide 40mg  daily Previously tried: Entresto BP goal: <130/80  Wt Readings from Last 3 Encounters:  05/29/20 233 lb 6.4 oz (105.9 kg)  02/20/20 235 lb (106.6 kg)  05/15/19 235 lb (106.6 kg)   BP Readings from Last 3 Encounters:  05/29/20 122/90  02/20/20 (!) 138/96  05/15/19 130/84   Pulse Readings from Last 3 Encounters:  05/29/20 90  02/20/20 84  05/15/19 85    Renal function: CrCl cannot be calculated (Patient's most recent lab result is older than the maximum 21 days allowed.).  Past Medical History:  Diagnosis Date  . Chronic combined systolic and diastolic CHF (congestive heart failure) (HCC)   .  Hypertension   . LBBB (left bundle branch block)   . NICM (nonischemic cardiomyopathy) (HCC)   . Pericardial effusion    a. trivial by echo 09/2017.    Current Outpatient Medications on File Prior to Visit  Medication Sig Dispense Refill  . carvedilol (COREG) 12.5 MG tablet Take 1 tablet (12.5 mg total) by mouth 2 (two) times daily with a meal. 180 tablet 3  . furosemide (LASIX) 40 MG tablet Take 1 tablet (40 mg total) by mouth daily. Pt must keep upcoming appt in Dec for further refills 30 tablet 0  . losartan (COZAAR) 50 MG tablet TAKE 1 TABLET BY MOUTH TWICE DAILY.MAKE APPOINTMENT AT [phone] 180 tablet 3  . potassium chloride (KLOR-CON) 10 MEQ tablet TAKE 1 TABLET(10 MEQ) BY MOUTH DAILY 90 tablet 3  . spironolactone (ALDACTONE) 25 MG tablet TAKE 1 TABLET(25 MG) BY MOUTH DAILY 30 tablet 8  . Vitamin D, Ergocalciferol, (DRISDOL) 1.25 MG (50000 UNIT) CAPS capsule Take 50,000 Units by mouth once a week.     No current facility-administered medications on file prior to visit.    Allergies  Allergen Reactions  . Penicillins Hives    As child  Assessment/Plan:  1. CHF - Patient BP in room 142/91 (using machine) and 152/94 (manually checked) which is above goal of <130/80.  Patient did not realize his losartan was increased to 100mg .  Instructed patient to begin taking two 50mg  tablets in the morning since he reports he has an easier time remembering his morning medications.    Recommended patient purchase a scale he can use and begin to weigh himself to check for edema.  Recommended patient increase physcial activity to at least 30 minutes a day at least 5 days a week.  Recommended patient change his diet to include more vegetables, whole grains, lean proteins, and healthy fats.  Patient drinks a lot of sugary beverages and has family history of DM.  Last A1c was 5.6 in 2019. Since patient has no upcoming PCP today, will add on A1c to BMP labs.  Possible he is now pre diabetic or  diabetic.    Could still benefit from increasing carvedilol, spiro, or addition of SGLT2 however will update labs and have patient increase his losartan to actual dose first.  Start losartan 100mg  daily Continue spironolactone 25mg  daily Continue furosemide 40mg  daily Continue carvedilol 12.5mg  BID Check BMP Check A1c  , PharmD, BCACP, CDCES, CPP [facility] [address], [place], [place] Phone: [phone]; Fax: [phone] 08/22/2020 5:10 PM

## 2020-08-23 LAB — BASIC METABOLIC PANEL
BUN/Creatinine Ratio: 17 (ref 9–20)
BUN: 14 mg/dL (ref 6–24)
CO2: 20 mmol/L (ref 20–29)
Calcium: 8.7 mg/dL (ref 8.7–10.2)
Chloride: 99 mmol/L (ref 96–106)
Creatinine, Ser: 0.82 mg/dL (ref 0.76–1.27)
Glucose: 155 mg/dL — ABNORMAL HIGH (ref 65–99)
Potassium: 4.2 mmol/L (ref 3.5–5.2)
Sodium: 134 mmol/L (ref 134–144)
eGFR: 112 mL/min/{1.73_m2} (ref >59)

## 2020-08-23 LAB — HEMOGLOBIN A1C
Est. average glucose Bld gHb Est-mCnc: 180 mg/dL
Hgb A1c MFr Bld: 7.9 % — ABNORMAL HIGH (ref 4.8–5.6)

## 2020-08-27 ENCOUNTER — Ambulatory Visit: Payer: Medicaid Other

## 2020-09-03 ENCOUNTER — Other Ambulatory Visit: Payer: Self-pay | Admitting: Cardiology

## 2020-12-30 IMAGING — DX DG CHEST 1V PORT
1 series · 1 of 1 positions shown · non-contrast
Comparison: 10/11/2017

CLINICAL DATA: Fever cough and fatigue

EXAM:
PORTABLE CHEST 1 VIEW

[chest ap]
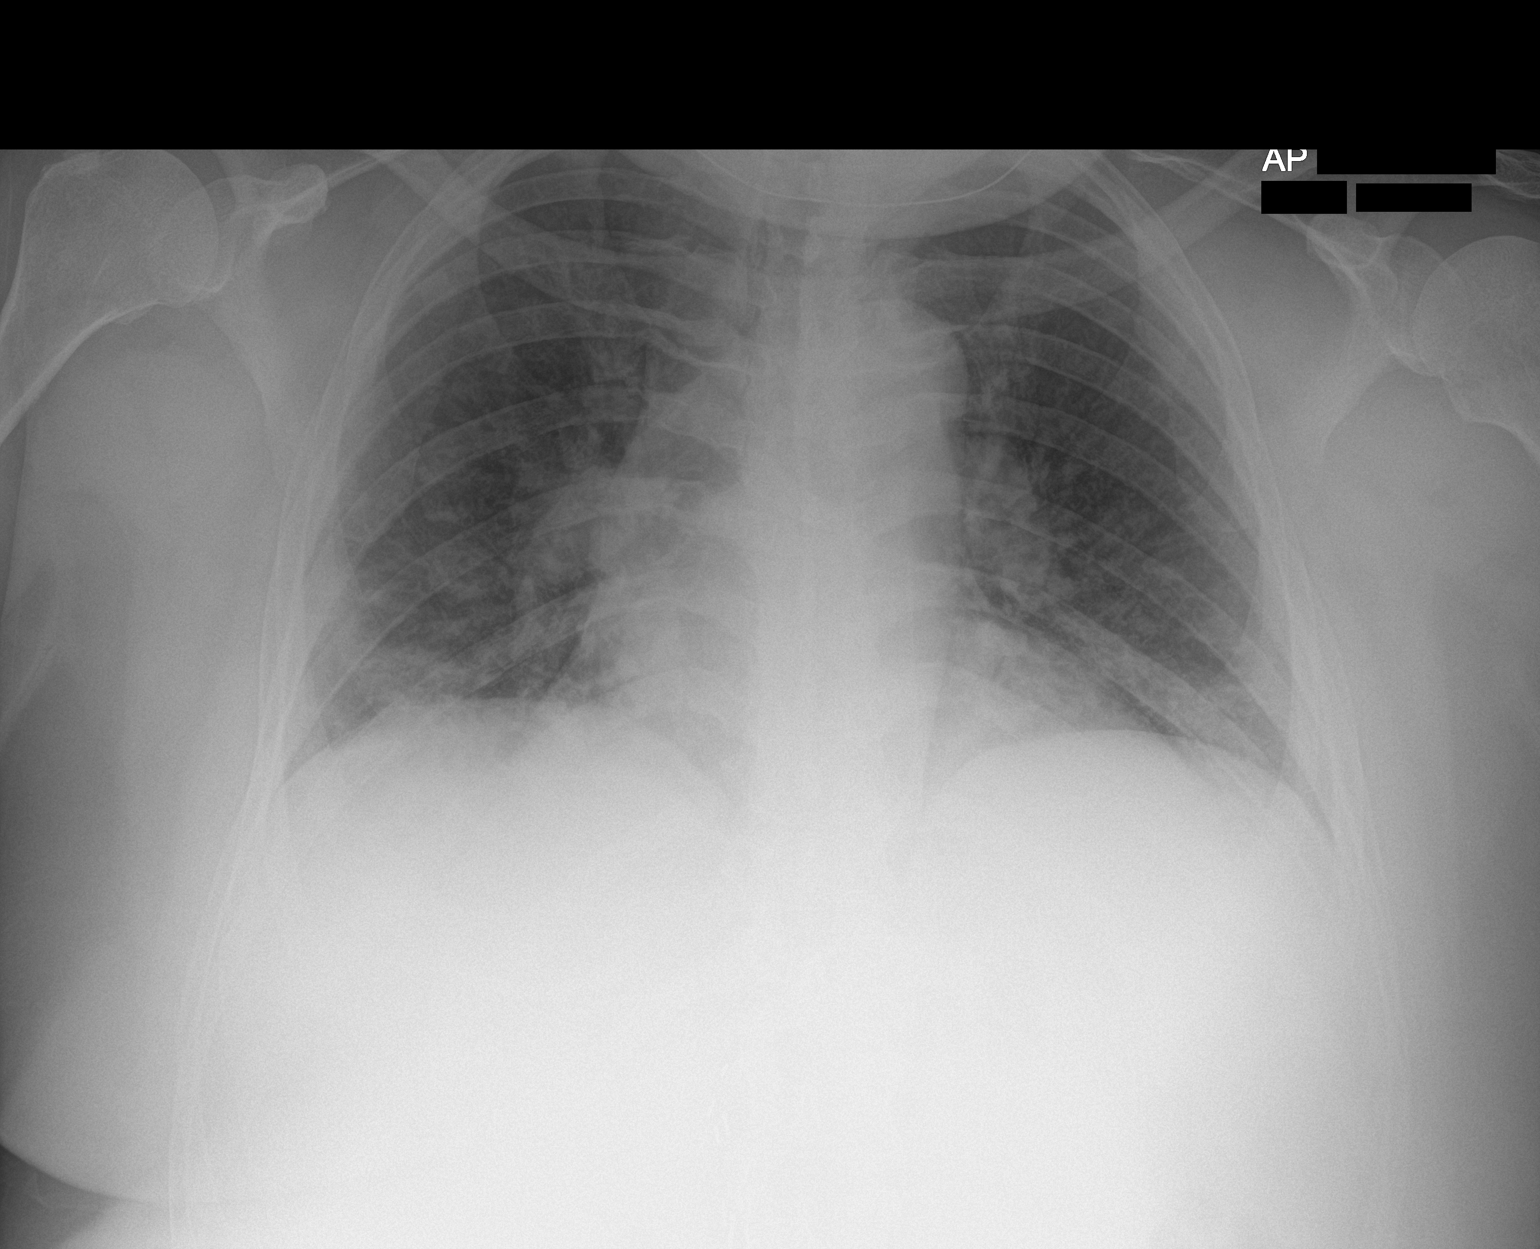

[1 of 1 positions shown; findings below may reference images not displayed]

FINDINGS: Low lung volumes. Patchy lower lung airspace opacities. Mild
cardiomegaly with vascular congestion. No pleural effusion or
pneumothorax.
IMPRESSION: Patchy airspace opacities in the lower lung zones suspicious for
pneumonia, possibly atypical or viral pneumonia. Mild cardiomegaly
with vascular congestion.

## 2021-05-21 ENCOUNTER — Other Ambulatory Visit: Payer: Self-pay

## 2021-05-21 DIAGNOSIS — I10 Essential (primary) hypertension: Secondary | ICD-10-CM

## 2021-05-21 MED ORDER — CARVEDILOL 12.5 MG PO TABS: 12.5000 mg | ORAL_TABLET | Freq: Two times a day (BID) | ORAL | 0 refills | Status: DC

## 2021-05-21 MED ORDER — LOSARTAN POTASSIUM 50 MG PO TABS: 100.0000 mg | ORAL_TABLET | Freq: Every day | ORAL | 0 refills | Status: DC

## 2021-05-21 NOTE — Telephone Encounter (Signed)
Pt's medications were sent to pt's pharmacy as requested. Confirmation received.  

## 2021-05-27 ENCOUNTER — Other Ambulatory Visit: Payer: Self-pay | Admitting: Cardiology

## 2021-05-27 DIAGNOSIS — I5022 Chronic systolic (congestive) heart failure: Secondary | ICD-10-CM

## 2021-05-27 DIAGNOSIS — G4719 Other hypersomnia: Secondary | ICD-10-CM

## 2021-07-01 ENCOUNTER — Other Ambulatory Visit: Payer: Self-pay

## 2021-07-01 ENCOUNTER — Telehealth: Payer: Medicaid Other | Admitting: Cardiology

## 2021-07-01 ENCOUNTER — Inpatient Hospital Stay (HOSPITAL_COMMUNITY)
Admission: EM | Admit: 2021-07-01 | Discharge: 2021-07-03 | DRG: 286 | Disposition: A | Discharge status: Home / Self Care | Payer: Medicaid Other | Attending: Internal Medicine | Admitting: Internal Medicine

## 2021-07-01 ENCOUNTER — Encounter (HOSPITAL_COMMUNITY): Payer: Self-pay

## 2021-07-01 ENCOUNTER — Emergency Department (HOSPITAL_COMMUNITY): Payer: Medicaid Other

## 2021-07-01 DIAGNOSIS — I5043 Acute on chronic combined systolic (congestive) and diastolic (congestive) heart failure: Secondary | ICD-10-CM | POA: Diagnosis present

## 2021-07-01 DIAGNOSIS — I447 Left bundle-branch block, unspecified: Secondary | ICD-10-CM | POA: Diagnosis present

## 2021-07-01 DIAGNOSIS — I420 Dilated cardiomyopathy: Secondary | ICD-10-CM | POA: Diagnosis present

## 2021-07-01 DIAGNOSIS — R0602 Shortness of breath: Secondary | ICD-10-CM | POA: Diagnosis present

## 2021-07-01 DIAGNOSIS — I10 Essential (primary) hypertension: Secondary | ICD-10-CM | POA: Diagnosis present

## 2021-07-01 DIAGNOSIS — Z8249 Family history of ischemic heart disease and other diseases of the circulatory system: Secondary | ICD-10-CM | POA: Diagnosis not present

## 2021-07-01 DIAGNOSIS — Z809 Family history of malignant neoplasm, unspecified: Secondary | ICD-10-CM | POA: Diagnosis not present

## 2021-07-01 DIAGNOSIS — I248 Other forms of acute ischemic heart disease: Secondary | ICD-10-CM | POA: Diagnosis present

## 2021-07-01 DIAGNOSIS — I5041 Acute combined systolic (congestive) and diastolic (congestive) heart failure: Secondary | ICD-10-CM | POA: Diagnosis not present

## 2021-07-01 DIAGNOSIS — I161 Hypertensive emergency: Secondary | ICD-10-CM | POA: Diagnosis present

## 2021-07-01 DIAGNOSIS — R778 Other specified abnormalities of plasma proteins: Secondary | ICD-10-CM | POA: Diagnosis not present

## 2021-07-01 DIAGNOSIS — I110 Hypertensive heart disease with heart failure: Secondary | ICD-10-CM | POA: Diagnosis present

## 2021-07-01 DIAGNOSIS — R7989 Other specified abnormal findings of blood chemistry: Secondary | ICD-10-CM

## 2021-07-01 DIAGNOSIS — Z79899 Other long term (current) drug therapy: Secondary | ICD-10-CM

## 2021-07-01 DIAGNOSIS — I214 Non-ST elevation (NSTEMI) myocardial infarction: Secondary | ICD-10-CM | POA: Diagnosis present

## 2021-07-01 DIAGNOSIS — Z85858 Personal history of malignant neoplasm of other endocrine glands: Secondary | ICD-10-CM | POA: Diagnosis not present

## 2021-07-01 DIAGNOSIS — I509 Heart failure, unspecified: Secondary | ICD-10-CM

## 2021-07-01 DIAGNOSIS — Z20822 Contact with and (suspected) exposure to covid-19: Secondary | ICD-10-CM | POA: Diagnosis present

## 2021-07-01 DIAGNOSIS — I2489 Other forms of acute ischemic heart disease: Secondary | ICD-10-CM

## 2021-07-01 LAB — COMPREHENSIVE METABOLIC PANEL
ALT: 91 U/L — ABNORMAL HIGH (ref 0–44)
AST: 76 U/L — ABNORMAL HIGH (ref 15–41)
Albumin: 3.7 g/dL (ref 3.5–5.0)
Alkaline Phosphatase: 77 U/L (ref 38–126)
Anion gap: 6 (ref 5–15)
BUN: 19 mg/dL (ref 6–20)
CO2: 24 mmol/L (ref 22–32)
Calcium: 8.1 mg/dL — ABNORMAL LOW (ref 8.9–10.3)
Chloride: 106 mmol/L (ref 98–111)
Creatinine, Ser: 0.99 mg/dL (ref 0.61–1.24)
GFR, Estimated: >60 mL/min (ref >60)
Glucose, Bld: 158 mg/dL — ABNORMAL HIGH (ref 70–99)
Potassium: 4.3 mmol/L (ref 3.5–5.1)
Sodium: 136 mmol/L (ref 135–145)
Total Bilirubin: 1.7 mg/dL — ABNORMAL HIGH (ref 0.3–1.2)
Total Protein: 6.4 g/dL — ABNORMAL LOW (ref 6.5–8.1)

## 2021-07-01 LAB — CBC WITH DIFFERENTIAL/PLATELET
Abs Immature Granulocytes: 0.02 10*3/uL (ref 0.00–0.07)
Basophils Absolute: 0.1 10*3/uL (ref 0.0–0.1)
Basophils Relative: 1 %
Eosinophils Absolute: 0.1 10*3/uL (ref 0.0–0.5)
Eosinophils Relative: 1 %
HCT: 43.8 % (ref 39.0–52.0)
Hemoglobin: 14.7 g/dL (ref 13.0–17.0)
Immature Granulocytes: 0 %
Lymphocytes Relative: 16 %
Lymphs Abs: 1.3 10*3/uL (ref 0.7–4.0)
MCH: 30.6 pg (ref 26.0–34.0)
MCHC: 33.6 g/dL (ref 30.0–36.0)
MCV: 91.1 fL (ref 80.0–100.0)
Monocytes Absolute: 0.5 10*3/uL (ref 0.1–1.0)
Monocytes Relative: 7 %
Neutro Abs: 6.2 10*3/uL (ref 1.7–7.7)
Neutrophils Relative %: 75 %
Platelets: UNDETERMINED 10*3/uL (ref 150–400)
RBC: 4.81 MIL/uL (ref 4.22–5.81)
RDW: 13.1 % (ref 11.5–15.5)
WBC: 8.3 10*3/uL (ref 4.0–10.5)
nRBC: 0 % (ref 0.0–0.2)

## 2021-07-01 LAB — HEMOGLOBIN A1C
Hgb A1c MFr Bld: 6.9 % — ABNORMAL HIGH (ref 4.8–5.6)
Mean Plasma Glucose: 151.33 mg/dL

## 2021-07-01 LAB — BRAIN NATRIURETIC PEPTIDE: B Natriuretic Peptide: 688 pg/mL — ABNORMAL HIGH (ref 0.0–100.0)

## 2021-07-01 LAB — RESP PANEL BY RT-PCR (FLU A&B, COVID) ARPGX2
Influenza A by PCR: NEGATIVE
Influenza B by PCR: NEGATIVE
SARS Coronavirus 2 by RT PCR: NEGATIVE

## 2021-07-01 LAB — TSH: TSH: 3.589 u[IU]/mL (ref 0.350–4.500)

## 2021-07-01 LAB — TROPONIN I (HIGH SENSITIVITY)
Troponin I (High Sensitivity): 2486 ng/L (ref <18)
Troponin I (High Sensitivity): 1249 ng/L (ref <18)
Troponin I (High Sensitivity): 2240 ng/L (ref <18)
Troponin I (High Sensitivity): 1971 ng/L (ref <18)

## 2021-07-01 LAB — HIV ANTIBODY (ROUTINE TESTING W REFLEX): HIV Screen 4th Generation wRfx: NONREACTIVE

## 2021-07-01 LAB — HEPARIN LEVEL (UNFRACTIONATED): Heparin Unfractionated: 0.21 IU/mL — ABNORMAL LOW (ref 0.30–0.70)

## 2021-07-01 MED ORDER — TURMERIC 500 MG PO CAPS
1.0000 | ORAL_CAPSULE | ORAL | Status: DC
Start: 2021-07-01 — End: 2021-07-01

## 2021-07-01 MED ORDER — ASPIRIN 81 MG PO CHEW
324.0000 mg | CHEWABLE_TABLET | Freq: Once | ORAL | Status: AC
Start: 2021-07-01 — End: 2021-07-01
  Administered 2021-07-01: 324 mg via ORAL
  Filled 2021-07-01: qty 4

## 2021-07-01 MED ORDER — NITROGLYCERIN 0.4 MG SL SUBL: 0.4000 mg | SUBLINGUAL_TABLET | SUBLINGUAL | Status: DC | PRN

## 2021-07-01 MED ORDER — HEPARIN BOLUS VIA INFUSION
4000.0000 [IU] | Freq: Once | INTRAVENOUS | Status: AC
  Administered 2021-07-01: 4000 [IU] via INTRAVENOUS
  Filled 2021-07-01: qty 4000

## 2021-07-01 MED ORDER — FUROSEMIDE 10 MG/ML IJ SOLN
40.0000 mg | Freq: Once | INTRAMUSCULAR | Status: AC
  Administered 2021-07-01: 40 mg via INTRAVENOUS
  Filled 2021-07-01: qty 4

## 2021-07-01 MED ORDER — HEPARIN (PORCINE) 25000 UT/250ML-% IV SOLN
1800.0000 [IU]/h | INTRAVENOUS | Status: DC
  Administered 2021-07-01: 1400 [IU]/h via INTRAVENOUS
  Administered 2021-07-02: 1800 [IU]/h via INTRAVENOUS
  Filled 2021-07-01: qty 250

## 2021-07-01 MED ORDER — FUROSEMIDE 10 MG/ML IJ SOLN
40.0000 mg | Freq: Every day | INTRAMUSCULAR | Status: DC
  Administered 2021-07-01 – 2021-07-03 (×3): 40 mg via INTRAVENOUS
  Filled 2021-07-01 (×3): qty 4

## 2021-07-01 MED ORDER — NITROGLYCERIN IN D5W 200-5 MCG/ML-% IV SOLN
0.0000 ug/min | INTRAVENOUS | Status: DC
  Administered 2021-07-01: 10 ug/min via INTRAVENOUS
  Administered 2021-07-01: 5 ug/min via INTRAVENOUS
  Administered 2021-07-02: 25 ug/min via INTRAVENOUS
  Administered 2021-07-02: 15 ug/min via INTRAVENOUS
  Administered 2021-07-02: 20 ug/min via INTRAVENOUS
  Filled 2021-07-01: qty 250

## 2021-07-01 MED ORDER — ASPIRIN EC 81 MG PO TBEC
81.0000 mg | DELAYED_RELEASE_TABLET | Freq: Every day | ORAL | Status: DC
  Administered 2021-07-02 – 2021-07-03 (×2): 81 mg via ORAL
  Filled 2021-07-01 (×2): qty 1

## 2021-07-01 MED ORDER — LOSARTAN POTASSIUM 50 MG PO TABS
100.0000 mg | ORAL_TABLET | Freq: Every day | ORAL | Status: DC
  Administered 2021-07-01 – 2021-07-03 (×3): 100 mg via ORAL
  Filled 2021-07-01 (×3): qty 2

## 2021-07-01 MED ORDER — POTASSIUM CHLORIDE ER 10 MEQ PO TBCR
10.0000 meq | EXTENDED_RELEASE_TABLET | Freq: Every day | ORAL | Status: DC
Start: 2021-07-01 — End: 2021-07-02
  Administered 2021-07-01: 10 meq via ORAL
  Filled 2021-07-01 (×4): qty 1

## 2021-07-01 MED ORDER — ONDANSETRON HCL 4 MG/2ML IJ SOLN: 4.0000 mg | Freq: Four times a day (QID) | INTRAMUSCULAR | Status: DC | PRN

## 2021-07-01 MED ORDER — VITAMIN D (ERGOCALCIFEROL) 1.25 MG (50000 UNIT) PO CAPS
50000.0000 [IU] | ORAL_CAPSULE | ORAL | Status: DC
  Administered 2021-07-02: 50000 [IU] via ORAL
  Filled 2021-07-01 (×2): qty 1

## 2021-07-01 MED ORDER — LABETALOL HCL 5 MG/ML IV SOLN
10.0000 mg | Freq: Once | INTRAVENOUS | Status: AC
  Administered 2021-07-01: 10 mg via INTRAVENOUS
  Filled 2021-07-01: qty 4

## 2021-07-01 MED ORDER — SPIRONOLACTONE 25 MG PO TABS
25.0000 mg | ORAL_TABLET | Freq: Every day | ORAL | Status: DC
  Administered 2021-07-01 – 2021-07-03 (×3): 25 mg via ORAL
  Filled 2021-07-01 (×3): qty 1

## 2021-07-01 MED ORDER — HEPARIN BOLUS VIA INFUSION
1000.0000 [IU] | Freq: Once | INTRAVENOUS | Status: AC
  Administered 2021-07-01: 1000 [IU] via INTRAVENOUS
  Filled 2021-07-01: qty 1000

## 2021-07-01 MED ORDER — HEPARIN (PORCINE) 25000 UT/250ML-% IV SOLN: 1150.0000 [IU]/h | INTRAVENOUS | Status: DC

## 2021-07-01 MED ORDER — HEPARIN (PORCINE) 25000 UT/250ML-% IV SOLN
1250.0000 [IU]/h | INTRAVENOUS | Status: DC
  Administered 2021-07-01: 1250 [IU]/h via INTRAVENOUS
  Filled 2021-07-01: qty 250

## 2021-07-01 MED ORDER — CARVEDILOL 12.5 MG PO TABS
12.5000 mg | ORAL_TABLET | Freq: Two times a day (BID) | ORAL | Status: DC
  Administered 2021-07-01 – 2021-07-03 (×5): 12.5 mg via ORAL
  Filled 2021-07-01 (×5): qty 1

## 2021-07-01 MED ORDER — ACETAMINOPHEN 325 MG PO TABS: 650.0000 mg | ORAL_TABLET | ORAL | Status: DC | PRN

## 2021-07-01 NOTE — ED Provider Notes (Signed)
[facility] DEPT Provider Note   CSN: [id number] Arrival date & time: 07/01/21  1041     History  Chief Complaint  Patient presents with   Shortness of Breath    John Olsen is a 46 y.o. male.  Pt is a 46 yo wm with a hx of CHF, htn, and nonischemic cardiomyopathypresents to the ED today with sob.  Pt said the sob has worsened over the last week.  He went to UC yesterday and was told to come to the ED because he had some pulmonary edema on his CXR.  Pt denies any cp.  He said he can't lay flat in bed.  He said he's been taking his meds.       Home Medications Prior to Admission medications   Medication Sig Start Date End Date Taking? Authorizing Provider  carvedilol (COREG) 12.5 MG tablet Take 1 tablet (12.5 mg total) by mouth 2 (two) times daily with a meal. Please make yearly appt with Dr. Radford Pax for December 2022 for future refills. Thank you 1st attempt Patient taking differently: Take 12.5 mg by mouth 2 (two) times daily with a meal. 05/21/21  Yes Turner, Eber Hong, MD  furosemide (LASIX) 40 MG tablet Take 1 tablet (40 mg total) by mouth daily. 09/04/20  Yes Turner, Eber Hong, MD  losartan (COZAAR) 50 MG tablet Take 2 tablets (100 mg total) by mouth daily. 05/21/21  Yes Turner, Eber Hong, MD  potassium chloride (KLOR-CON) 10 MEQ tablet Take 10 mEq by mouth daily. 05/20/21  Yes [provider]  spironolactone (ALDACTONE) 25 MG tablet TAKE 1 TABLET(25 MG) BY MOUTH DAILY Patient taking differently: Take 25 mg by mouth daily. 08/06/20  Yes Turner, Eber Hong, MD  Turmeric 500 MG CAPS Take 1-2 tablets by mouth once a week.   Yes [provider]  Vitamin D, Ergocalciferol, (DRISDOL) 1.25 MG (50000 UNIT) CAPS capsule Take 50,000 Units by mouth once a week. 03/26/20  Yes [provider]  potassium chloride (KLOR-CON) 10 MEQ tablet TAKE 1 TABLET(10 MEQ) BY MOUTH DAILY Patient not taking: Reported on 07/01/2021 07/02/20   Sueanne Margarita, MD       Allergies    Penicillins    Review of Systems   Review of Systems  Respiratory:  Positive for shortness of breath.   All other systems reviewed and are negative.  Physical Exam Updated Vital Signs BP (!) 186/131    Pulse 85    Temp 97.6 F (36.4 C) (Oral)    Resp (!) 26    Ht 5\' 6"  (1.676 m)    Wt 112 kg    SpO2 92%    BMI 39.87 kg/m  Physical Exam Vitals and nursing note reviewed.  Constitutional:      Appearance: He is well-developed. He is obese.  HENT:     Head: Normocephalic and atraumatic.     Mouth/Throat:     Mouth: Mucous membranes are moist.     Pharynx: Oropharynx is clear.  Eyes:     Extraocular Movements: Extraocular movements intact.     Pupils: Pupils are equal, round, and reactive to light.  Cardiovascular:     Rate and Rhythm: Normal rate and regular rhythm.  Pulmonary:     Effort: Pulmonary effort is normal.     Breath sounds: Normal breath sounds.  Abdominal:     General: Bowel sounds are normal.     Palpations: Abdomen is soft.  Musculoskeletal:  General: Normal range of motion.     Cervical back: Normal range of motion and neck supple.  Skin:    General: Skin is warm.     Capillary Refill: Capillary refill takes less than 2 seconds.  Neurological:     General: No focal deficit present.     Mental Status: He is alert and oriented to person, place, and time.  Psychiatric:        Mood and Affect: Mood normal.        Behavior: Behavior normal.    ED Results / Procedures / Treatments   Labs (all labs ordered are listed, but only abnormal results are displayed) Labs Reviewed  BRAIN NATRIURETIC PEPTIDE - Abnormal; Notable for the following components:      Result Value   B Natriuretic Peptide 688.0 (*)    All other components within normal limits  COMPREHENSIVE METABOLIC PANEL - Abnormal; Notable for the following components:   Glucose, Bld 158 (*)    Calcium 8.1 (*)    Total Protein 6.4 (*)    AST 76 (*)    ALT 91 (*)    Total  Bilirubin 1.7 (*)    All other components within normal limits  TROPONIN I (HIGH SENSITIVITY) - Abnormal; Notable for the following components:   Troponin I (High Sensitivity) 2,486 (*)    All other components within normal limits  RESP PANEL BY RT-PCR (FLU A&B, COVID) ARPGX2  CBC WITH DIFFERENTIAL/PLATELET  TROPONIN I (HIGH SENSITIVITY)    EKG EKG Interpretation  Date/Time:  Tuesday July 01 2021 11:09:35 EST Ventricular Rate:  86 PR Interval:  181 QRS Duration: 194 QT Interval:  488 QTC Calculation: 584 R Axis:   -47 Text Interpretation: Sinus rhythm Biatrial enlargement Nonspecific IVCD with LAD Left ventricular hypertrophy Baseline wander in lead(s) V2 No significant change since last tracing Confirmed by Jacalyn Lefevre [phone]) on 07/01/2021 11:32:24 AM  Radiology DG Chest 2 View  Result Date: 07/01/2021 CLINICAL DATA:  Shortness of breath EXAM: CHEST - 2 VIEW COMPARISON:  Chest radiograph 02/24/2020 FINDINGS: Heart is markedly enlarged, unchanged. The mediastinal contours are stable. Enlargement of the pulmonary vasculature particularly on the right suggests pulmonary hypertension. There is central vascular congestion without evidence of overt pulmonary edema. There is no focal consolidation. There is no pleural effusion or pneumothorax. There is no acute osseous abnormality. Abdominal surgical clips are noted. IMPRESSION: Cardiomegaly with vascular congestion but no definite overt pulmonary edema. Electronically Signed   By: Lesia Hausen M.D.   On: 07/01/2021 11:42    Procedures Procedures    Medications Ordered in ED Medications  nitroGLYCERIN 50 mg in dextrose 5 % 250 mL (0.2 mg/mL) infusion (has no administration in time range)  furosemide (LASIX) injection 40 mg (40 mg Intravenous Given 07/01/21 1234)  labetalol (NORMODYNE) injection 10 mg (10 mg Intravenous Given 07/01/21 1355)    ED Course/ Medical Decision Making/ A&P                           Medical Decision  Making  Pt's Xray and labs reviewed.  EKG with no change.  CXR without significant pulmonary edema.  Pt given lasix.  BP very elevated.  No bp change with labetalol.  Nitro drip started for bp.  Pt's troponin significantly elevated.  Pt d/w Dr. Rennis [place] (cards).  He will come see pt.        Final Clinical Impression(s) / ED Diagnoses Final diagnoses:  Acute on chronic congestive heart failure, unspecified heart failure type [facility])  Hypertensive emergency  Elevated troponin    Rx / DC Orders ED Discharge Orders     None         Isla Pence, MD 07/01/21 1526

## 2021-07-01 NOTE — ED Notes (Signed)
Patient transported to X-ray 

## 2021-07-01 NOTE — ED Notes (Signed)
[facility] at bedside for transport at this time.

## 2021-07-01 NOTE — Progress Notes (Addendum)
CHF, HTN pt admitted to 3e04 from [facility] on Nitro drip and Heparin drip. Pt continues to be in NSR with L BBB. No chest pain. BP meds and diuretics given. EKG performed and in chart. MD paged.

## 2021-07-01 NOTE — Progress Notes (Signed)
ANTICOAGULATION CONSULT NOTE - Initial Consult  Pharmacy Consult for heparin Indication: chest pain/ACS  Allergies  Allergen Reactions   Penicillins Hives    As child    Patient Measurements: Height: 5\' 6"  (167.6 cm) Weight: 112 kg (247 lb) IBW/kg (Calculated) : 63.8 Heparin Dosing Weight: 89.4 kg  Vital Signs: Temp: 97.6 F (36.4 C) (01/10 1107) Temp Source: Oral (01/10 1107) BP: 165/121 (01/10 1537) Pulse Rate: 79 (01/10 1537)  Labs: Recent Labs    07/01/21 1136 07/01/21 1516  HGB 14.7  --   HCT 43.8  --   PLT PLATELET CLUMPS NOTED ON SMEAR, UNABLE TO ESTIMATE  --   CREATININE 0.99  --   TROPONINIHS 2,486* 1,249*    Estimated Creatinine Clearance: 113.1 mL/min (by C-G formula based on SCr of 0.99 mg/dL).   Medical History: Past Medical History:  Diagnosis Date   Chronic combined systolic and diastolic CHF (congestive heart failure) (HCC)    Hypertension    LBBB (left bundle branch block)    NICM (nonischemic cardiomyopathy) (HCC)    Pericardial effusion    a. trivial by echo 09/2017.    Medications:  No anticoagulants PTA  Assessment: Pharmacy consulted to dose heparin for elevated troponin, r/o ACS.   Hg WNL, PLT clumped, SCr 0.99 Troponin 2486> 1249  Goal of Therapy:  Heparin level 0.3-0.7 units/ml Monitor platelets by anticoagulation protocol: Yes   Plan:  Heparin 4000 unit bolus Heparin drip 1250 units/hr Check heparin level in 6 hours Daily CBC & heparin level while on heparin  10/2017, Pharm.D 07/01/2021 4:09 PM

## 2021-07-01 NOTE — ED Notes (Signed)
Pt called for triage x2. This EMT checked the lobby bathroom for the pt with no response.

## 2021-07-01 NOTE — ED Triage Notes (Signed)
Patient c/o SOB x 1 week. Patient states he went to an UC last night and was told to come to the ED today because he had fluid on his lungs. Patient reports increased SOB with exertion, talking, etc. Patient reports a history of CHF

## 2021-07-01 NOTE — H&P (Signed)
Admission History and Physical   Patient Name: John Olsen Date of Encounter: 07/01/2021 Cardiologist: Armanda Magic, MD Electrophysiologist: None Advanced Heart Failure: None   Chief Complaint   Shortness of breath  Patient Profile   46 year old male with chronic systolic heart failure, LVEF 40-45%, left bundle branch block and hypertension, presents with progressive shortness of breath and acute on chronic systolic congestive heart failure.  HPI   John Olsen is a 45 y.o. male who is being seen today for the evaluation of shortness of breath at the request of Dr. Particia Nearing.  This is a pleasant 46 year old male last seen by Dr. Mayford Knife for systolic heart failure in December 2021.  He was seen by pharmacy in March 2022 for hypertension management.  He had about a week of worsening shortness of breath and went to urgent care and was noted to have pulmonary edema on chest x-ray.  He has a history of nonischemic cardiomyopathy having had prior cardiac catheterization showing normal coronaries in 2019.  Last echo in January 2022 demonstrated EF at 40 to 45%, improved from 20% in the past) with severe concentric LVH and global hypokinesis.  Cardiac MRI was performed in February 2022 which showed moderate LV enlargement and global hypokinesis with abnormal septal motion and LVEF 34%, mild to moderate LVH, evidence of Fabry's disease or late gadolinium enhancement. As mentioned above he has a history of left bundle branch block which was wide on EKG.  In the past he has been intolerant of Entresto and has been on losartan, carvedilol and spironolactone.  Labs this admission showed a BNP of 688 and a markedly elevated troponin of 2486 and then 1248.  He denies any chest pain over the past week. He has minimal elevations in liver enzymes including AST and ALT of 76 and 91 with normal renal function creatinine 0.99.  Respiratory viral panel was negative.  Chest x-ray shows cardiomegaly with vascular  congestion.  EKG shows sinus rhythm with left bundle branch block at 86. He reports compliance with medications.  PMHx   Past Medical History:  Diagnosis Date   Chronic combined systolic and diastolic CHF (congestive heart failure) (HCC)    Hypertension    LBBB (left bundle branch block)    NICM (nonischemic cardiomyopathy) (HCC)    Pericardial effusion    a. trivial by echo 09/2017.    Past Surgical History:  Procedure Laterality Date   NEUROBLASTOMA EXCISION     Had prior to 46 year of age, abdominal   RIGHT/LEFT HEART CATH AND CORONARY ANGIOGRAPHY N/A 10/13/2017   Procedure: RIGHT/LEFT HEART CATH AND CORONARY ANGIOGRAPHY;  Surgeon: Lyn Records, MD;  Location: [facility] INVASIVE CV LAB;  Service: Cardiovascular;  Laterality: N/A;   ULTRASOUND GUIDANCE FOR VASCULAR ACCESS  10/13/2017   Procedure: Ultrasound Guidance For Vascular Access;  Surgeon: Lyn Records, MD;  Location: [facility] INVASIVE CV LAB;  Service: Cardiovascular;;    FAMHx   Family History  Problem Relation Age of Onset   Cancer Mother    Coronary artery disease Father        had a stent approx 2015    SOCHx    reports that he has never smoked. He has never used smokeless tobacco. He reports that he does not drink alcohol and does not use drugs.  Outpatient Medications   No current facility-administered medications on file prior to encounter.   Current Outpatient Medications on File Prior to Encounter  Medication Sig Dispense Refill   carvedilol (COREG)  12.5 MG tablet Take 1 tablet (12.5 mg total) by mouth 2 (two) times daily with a meal. Please make yearly appt with Dr. Mayford Knife for December 2022 for future refills. Thank you 1st attempt (Patient taking differently: Take 12.5 mg by mouth 2 (two) times daily with a meal.) 180 tablet 0   furosemide (LASIX) 40 MG tablet Take 1 tablet (40 mg total) by mouth daily. 90 tablet 3   losartan (COZAAR) 50 MG tablet Take 2 tablets (100 mg total) by mouth daily. 180 tablet 0    potassium chloride (KLOR-CON) 10 MEQ tablet Take 10 mEq by mouth daily.     spironolactone (ALDACTONE) 25 MG tablet TAKE 1 TABLET(25 MG) BY MOUTH DAILY (Patient taking differently: Take 25 mg by mouth daily.) 30 tablet 8   Turmeric 500 MG CAPS Take 1-2 tablets by mouth once a week.     Vitamin D, Ergocalciferol, (DRISDOL) 1.25 MG (50000 UNIT) CAPS capsule Take 50,000 Units by mouth once a week.     potassium chloride (KLOR-CON) 10 MEQ tablet TAKE 1 TABLET(10 MEQ) BY MOUTH DAILY (Patient not taking: Reported on 07/01/2021) 90 tablet 3    Inpatient Medications    Scheduled Meds:   Continuous Infusions:  nitroGLYCERIN 5 mcg/min (07/01/21 1534)    PRN Meds:    ALLERGIES   Allergies  Allergen Reactions   Penicillins Hives    As child    ROS   Pertinent items noted in HPI and remainder of comprehensive ROS otherwise negative.  Vitals   Vitals:   07/01/21 1245 07/01/21 1300 07/01/21 1411 07/01/21 1458  BP: (!) 176/136 (!) 158/121 (!) 172/121 (!) 186/131  Pulse: 87 98 66 85  Resp: (!) 27 19 (!) 28 (!) 26  Temp:      TempSrc:      SpO2: 92% 96% 91% 92%  Weight:      Height:       No intake or output data in the 24 hours ending 07/01/21 1541 Filed Weights   07/01/21 1109  Weight: 112 kg    Physical Exam   General appearance: alert, no distress, and sitting upright Neck: JVD - several cm above sternal notch, no carotid bruit, and thyroid not enlarged, symmetric, no tenderness/mass/nodules Lungs: diminished breath sounds bibasilar Heart: regular rate and rhythm Abdomen: soft, non-tender; bowel sounds normal; no masses,  no organomegaly and obese Extremities: edema trace to 1+ Pulses: 2+ and symmetric Skin: chronic bilateral LE venous stasis changes Neurologic: Grossly normal Psych: Pleasant  Labs   Results for orders placed or performed during the hospital encounter of 07/01/21 (from the past 48 hour(s))  Brain natriuretic peptide     Status: Abnormal    Collection Time: 07/01/21 11:36 AM  Result Value Ref Range   B Natriuretic Peptide 688.0 (H) 0.0 - 100.0 pg/mL    Comment: Performed at [facility], [address] [place], [place] [address]  Comprehensive metabolic panel     Status: Abnormal   Collection Time: 07/01/21 11:36 AM  Result Value Ref Range   Sodium 136 135 - 145 mmol/L   Potassium 4.3 3.5 - 5.1 mmol/L    Comment: SLIGHT HEMOLYSIS   Chloride 106 98 - 111 mmol/L   CO2 24 22 - 32 mmol/L   Glucose, Bld 158 (H) 70 - 99 mg/dL    Comment: Glucose reference range applies only to samples taken after fasting for at least 8 hours.   BUN 19 6 - 20 mg/dL  Creatinine, Ser 0.99 0.61 - 1.24 mg/dL   Calcium 8.1 (L) 8.9 - 10.3 mg/dL   Total Protein 6.4 (L) 6.5 - 8.1 g/dL   Albumin 3.7 3.5 - 5.0 g/dL   AST 76 (H) 15 - 41 U/L   ALT 91 (H) 0 - 44 U/L   Alkaline Phosphatase 77 38 - 126 U/L   Total Bilirubin 1.7 (H) 0.3 - 1.2 mg/dL   GFR, Estimated >03 >47 mL/min    Comment: (NOTE) Calculated using the CKD-EPI Creatinine Equation (2021)    Anion gap 6 5 - 15    Comment: Performed at [facility], [address], [place], [place] [address]  CBC with Differential     Status: None   Collection Time: 07/01/21 11:36 AM  Result Value Ref Range   WBC 8.3 4.0 - 10.5 K/uL   RBC 4.81 4.22 - 5.81 MIL/uL   Hemoglobin 14.7 13.0 - 17.0 g/dL   HCT 63.8 75.6 - 43.3 %   MCV 91.1 80.0 - 100.0 fL   MCH 30.6 26.0 - 34.0 pg   MCHC 33.6 30.0 - 36.0 g/dL   RDW 29.5 18.8 - 41.6 %   Platelets PLATELET CLUMPS NOTED ON SMEAR, UNABLE TO ESTIMATE 150 - 400 K/uL   nRBC 0.0 0.0 - 0.2 %   Neutrophils Relative % 75 %   Neutro Abs 6.2 1.7 - 7.7 K/uL   Lymphocytes Relative 16 %   Lymphs Abs 1.3 0.7 - 4.0 K/uL   Monocytes Relative 7 %   Monocytes Absolute 0.5 0.1 - 1.0 K/uL   Eosinophils Relative 1 %   Eosinophils Absolute 0.1 0.0 - 0.5 K/uL   Basophils Relative 1 %   Basophils Absolute 0.1 0.0 - 0.1 K/uL   Immature  Granulocytes 0 %   Abs Immature Granulocytes 0.02 0.00 - 0.07 K/uL    Comment: Performed at [facility], [address], [place], [place] [address]  Troponin I (High Sensitivity)     Status: Abnormal   Collection Time: 07/01/21 11:36 AM  Result Value Ref Range   Troponin I (High Sensitivity) 2,486 (HH) <18 ng/L    Comment: CRITICAL RESULT CALLED TO, READ BACK BY AND VERIFIED WITH: BRATU,D. EMTP AT 1445 07/01/21 MULLINS,T (NOTE) Elevated high sensitivity troponin I (hsTnI) values and significant  changes across serial measurements may suggest ACS but many other  chronic and acute conditions are known to elevate hsTnI results.  Refer to the Links section for chest pain algorithms and additional  guidance. Performed at [facility], [address] [place], [place] [address]   Resp Panel by RT-PCR (Flu A&B, Covid) Nasopharyngeal Swab     Status: None   Collection Time: 07/01/21 12:34 PM   Specimen: Nasopharyngeal Swab; Nasopharyngeal(NP) swabs in vial transport medium  Result Value Ref Range   SARS Coronavirus 2 by RT PCR NEGATIVE NEGATIVE    Comment: (NOTE) SARS-CoV-2 target nucleic acids are NOT DETECTED.  The SARS-CoV-2 RNA is generally detectable in upper respiratory specimens during the acute phase of infection. The lowest concentration of SARS-CoV-2 viral copies this assay can detect is 138 copies/mL. A negative result does not preclude SARS-Cov-2 infection and should not be used as the sole basis for treatment or other patient management decisions. A negative result may occur with  improper specimen collection/handling, submission of specimen other than nasopharyngeal swab, presence of viral mutation(s) within the areas targeted by this assay, and inadequate number of viral copies(<138 copies/mL). A  negative result must be combined with clinical observations, patient history, and epidemiological information. The expected result is  Negative.  Fact Sheet for Patients:  BloggerCourse.com  Fact Sheet for Healthcare Providers:  SeriousBroker.it  This test is no t yet approved or cleared by the [place] FDA and  has been authorized for detection and/or diagnosis of SARS-CoV-2 by FDA under an Emergency Use Authorization (EUA). This EUA will remain  in effect (meaning this test can be used) for the duration of the COVID-19 declaration under Section 564(b)(1) of the Act, 21 U.S.C.section 360bbb-3(b)(1), unless the authorization is terminated  or revoked sooner.       Influenza A by PCR NEGATIVE NEGATIVE   Influenza B by PCR NEGATIVE NEGATIVE    Comment: (NOTE) The Xpert Xpress SARS-CoV-2/FLU/RSV plus assay is intended as an aid in the diagnosis of influenza from Nasopharyngeal swab specimens and should not be used as a sole basis for treatment. Nasal washings and aspirates are unacceptable for Xpert Xpress SARS-CoV-2/FLU/RSV testing.  Fact Sheet for Patients: BloggerCourse.com  Fact Sheet for Healthcare Providers: SeriousBroker.it  This test is not yet approved or cleared by the [place] FDA and has been authorized for detection and/or diagnosis of SARS-CoV-2 by FDA under an Emergency Use Authorization (EUA). This EUA will remain in effect (meaning this test can be used) for the duration of the COVID-19 declaration under Section 564(b)(1) of the Act, 21 U.S.C. section 360bbb-3(b)(1), unless the authorization is terminated or revoked.  Performed at [facility], [address], [place], [place] [address]     ECG   Sinus rhythm with left bundle branch block at 86, QRSd 194 ms- Personally Reviewed  Telemetry   Sinus rhythm- Personally Reviewed  Radiology   DG Chest 2 View  Result Date: 07/01/2021 CLINICAL DATA:  Shortness of breath EXAM: CHEST - 2 VIEW COMPARISON:  Chest  radiograph 02/24/2020 FINDINGS: Heart is markedly enlarged, unchanged. The mediastinal contours are stable. Enlargement of the pulmonary vasculature particularly on the right suggests pulmonary hypertension. There is central vascular congestion without evidence of overt pulmonary edema. There is no focal consolidation. There is no pleural effusion or pneumothorax. There is no acute osseous abnormality. Abdominal surgical clips are noted. IMPRESSION: Cardiomegaly with vascular congestion but no definite overt pulmonary edema. Electronically Signed   By: Lesia Hausen M.D.   On: 07/01/2021 11:42    Cardiac Studies   N/A  Impression   Principal Problem:   Acute combined systolic and diastolic congestive heart failure (HCC) Active Problems:   LBBB (left bundle branch block)   DCM (dilated cardiomyopathy) (HCC)   Essential hypertension   Demand ischemia (HCC)   Recommendation   Acute combined systolic and diastolic congestive heart failure, NYHA class IV symptoms  Mr. Blowe presents with progressive shortness of breath over the past several weeks consistent with acute on chronic systolic congestive heart failure.  EF was 40 to 45% in January 2022.  He denies any antecedent chest pain however had initial troponin elevation of 2486 on admission that decreased down to 1249 approximately 4 hours later.  He had no evidence of coronary disease by cath in 2019 however cannot rule out development of coronary disease especially given family history of early onset heart disease. Empiric treatment for ACS as below #2 Diuresis with 40 mg IV Lasix twice daily Continue IV nitroglycerin IV heparin Repeat echocardiogram to assess for change in LV function NSTEMI versus demand ischemia As above troponins are markedly elevated and  downtrending suggestive of possible prehospital MI.  Other possibilities include a myocarditis or demand ischemia.  Continue IV nitroglycerin Start IV heparin. Give aspirin 324 mg of  chewable now then 81 mg daily thereafter Continue home doses of losartan and carvedilol May need repeat heart catheterization.  Please keep n.p.o. after midnight History of dilated cardiomyopathy Will repeat echocardiogram to reassess LV function in the setting of new troponin elevations.  Thought to have nonischemic cardiomyopathy in the past.  Cardiac MRI showed no late gadolinium enhancement but severe LV wall thickening without evidence of Fabry's disease Left bundle branch block with wide QRS greater than 150 ms Notable QRS widening up to 190 ms with left bundle branch block morphology, however was not considered a candidate for CRT-D/P therapy in the past due to improvement in LVEF greater than 40%.  This may need to be reconsidered if her EF has further declined. Essential hypertension Blood pressure was elevated on admission.   IV nitroglycerin.   Continue home losartan and carvedilol. Diuresis Full Code  Time Spent Directly with Patient:  I have spent a total of 45 minutes with the patient reviewing hospital notes, telemetry, EKGs, labs and examining the patient as well as establishing an assessment and plan that was discussed personally with the patient.  > 50% of time was spent in direct patient care.  Length of Stay:  LOS: 0 days   Chrystie Nose, MD, [facility], FACP  [place]   [facility]  Medical Director of the Advanced Lipid Disorders &  Cardiovascular Risk Reduction Clinic Diplomate of the American Board of Clinical Lipidology Attending Cardiologist  Direct Dial: [phone]   Fax: [phone]  Website:  [email or web address] Nicely John Olsen 07/01/2021, 3:41 PM

## 2021-07-01 NOTE — Progress Notes (Signed)
ANTICOAGULATION CONSULT NOTE   Pharmacy Consult for heparin Indication: chest pain/ACS  Allergies  Allergen Reactions   Penicillins Hives    As child    Patient Measurements: Height: 5\' 6"  (167.6 cm) Weight: 112 kg (247 lb) IBW/kg (Calculated) : 63.8 Heparin Dosing Weight: 89.4 kg  Vital Signs: Temp: 97.4 F (36.3 C) (01/10 1921) Temp Source: Oral (01/10 1921) BP: 150/96 (01/10 2105) Pulse Rate: 81 (01/10 1830)  Labs: Recent Labs    07/01/21 1136 07/01/21 1516 07/01/21 2015  HGB 14.7  --   --   HCT 43.8  --   --   PLT PLATELET CLUMPS NOTED ON SMEAR, UNABLE TO ESTIMATE  --   --   HEPARINUNFRC  --   --  0.21*  CREATININE 0.99  --   --   TROPONINIHS 2,486* 1,249*  --      Estimated Creatinine Clearance: 113.1 mL/min (by C-G formula based on SCr of 0.99 mg/dL).  Assessment: Pharmacy consulted to dose heparin for elevated troponin, r/o ACS.   Hg WNL, PLT clumped, SCr 0.99. Troponin 2486> 1249.  Heparin level subtherapeutic (0.21) on gtt at 1250 units/hr. No issues with line per RN. Small amount of blood on IV site but dressing changed and seems to resolved. Heparin level was drawn early (only about 3.5 hrs post start).  Goal of Therapy:  Heparin level 0.3-0.7 units/ml Monitor platelets by anticoagulation protocol: Yes   Plan:  Rebolus heparin 1000 units Increase heparin drip to 1400 units/hr Check heparin level in 6 hours  08/29/21, PharmD, BCPS Please see amion for complete clinical pharmacist phone list 07/01/2021 9:10 PM

## 2021-07-02 ENCOUNTER — Other Ambulatory Visit (HOSPITAL_COMMUNITY): Payer: Self-pay

## 2021-07-02 ENCOUNTER — Inpatient Hospital Stay (HOSPITAL_COMMUNITY): Payer: Medicaid Other

## 2021-07-02 DIAGNOSIS — I5041 Acute combined systolic (congestive) and diastolic (congestive) heart failure: Secondary | ICD-10-CM | POA: Diagnosis not present

## 2021-07-02 DIAGNOSIS — I10 Essential (primary) hypertension: Secondary | ICD-10-CM | POA: Diagnosis not present

## 2021-07-02 DIAGNOSIS — I214 Non-ST elevation (NSTEMI) myocardial infarction: Secondary | ICD-10-CM

## 2021-07-02 DIAGNOSIS — I248 Other forms of acute ischemic heart disease: Secondary | ICD-10-CM

## 2021-07-02 DIAGNOSIS — I420 Dilated cardiomyopathy: Secondary | ICD-10-CM | POA: Diagnosis not present

## 2021-07-02 LAB — BASIC METABOLIC PANEL
Anion gap: 11 (ref 5–15)
BUN: 16 mg/dL (ref 6–20)
CO2: 25 mmol/L (ref 22–32)
Calcium: 8.1 mg/dL — ABNORMAL LOW (ref 8.9–10.3)
Chloride: 102 mmol/L (ref 98–111)
Creatinine, Ser: 1.08 mg/dL (ref 0.61–1.24)
GFR, Estimated: >60 mL/min (ref >60)
Glucose, Bld: 118 mg/dL — ABNORMAL HIGH (ref 70–99)
Potassium: 3.7 mmol/L (ref 3.5–5.1)
Sodium: 138 mmol/L (ref 135–145)

## 2021-07-02 LAB — HEPARIN LEVEL (UNFRACTIONATED)
Heparin Unfractionated: <0.1 IU/mL — ABNORMAL LOW (ref 0.30–0.70)
Heparin Unfractionated: 0.16 IU/mL — ABNORMAL LOW (ref 0.30–0.70)
Heparin Unfractionated: 0.31 IU/mL (ref 0.30–0.70)

## 2021-07-02 LAB — CBC
HCT: 42.5 % (ref 39.0–52.0)
Hemoglobin: 14.1 g/dL (ref 13.0–17.0)
MCH: 30.3 pg (ref 26.0–34.0)
MCHC: 33.2 g/dL (ref 30.0–36.0)
MCV: 91.2 fL (ref 80.0–100.0)
Platelets: 228 10*3/uL (ref 150–400)
RBC: 4.66 MIL/uL (ref 4.22–5.81)
RDW: 13.2 % (ref 11.5–15.5)
WBC: 7.5 10*3/uL (ref 4.0–10.5)
nRBC: 0 % (ref 0.0–0.2)

## 2021-07-02 LAB — ECHOCARDIOGRAM COMPLETE
Area-P 1/2: 3.85 cm2
Calc EF: 34.8 %
Height: 66 in
Radius: 0.4 cm
S' Lateral: 6 cm
Single Plane A2C EF: 36.2 %
Single Plane A4C EF: 30.7 %
Weight: 3748.8 oz

## 2021-07-02 LAB — LIPID PANEL
Cholesterol: 102 mg/dL (ref 0–200)
HDL: 32 mg/dL — ABNORMAL LOW (ref >40)
LDL Cholesterol: 53 mg/dL (ref 0–99)
Total CHOL/HDL Ratio: 3.2 RATIO
Triglycerides: 85 mg/dL (ref <150)
VLDL: 17 mg/dL (ref 0–40)

## 2021-07-02 MED ORDER — SODIUM CHLORIDE 0.9 % IV SOLN: INTRAVENOUS | Status: DC

## 2021-07-02 MED ORDER — SODIUM CHLORIDE 0.9% FLUSH: 3.0000 mL | INTRAVENOUS | Status: DC | PRN

## 2021-07-02 MED ORDER — SODIUM CHLORIDE 0.9 % IV SOLN: 250.0000 mL | INTRAVENOUS | Status: DC | PRN

## 2021-07-02 MED ORDER — HEPARIN (PORCINE) 25000 UT/250ML-% IV SOLN
2250.0000 [IU]/h | INTRAVENOUS | Status: DC
  Administered 2021-07-02: 2100 [IU]/h via INTRAVENOUS
  Administered 2021-07-02: 2250 [IU]/h via INTRAVENOUS
  Filled 2021-07-02: qty 250

## 2021-07-02 MED ORDER — LIVING WELL WITH DIABETES BOOK
Freq: Once | Status: AC
  Filled 2021-07-02: qty 1

## 2021-07-02 MED ORDER — HEPARIN BOLUS VIA INFUSION
3000.0000 [IU] | Freq: Once | INTRAVENOUS | Status: AC
  Administered 2021-07-02: 3000 [IU] via INTRAVENOUS
  Filled 2021-07-02: qty 3000

## 2021-07-02 MED ORDER — POTASSIUM CHLORIDE CRYS ER 10 MEQ PO TBCR
20.0000 meq | EXTENDED_RELEASE_TABLET | Freq: Every day | ORAL | Status: DC
  Administered 2021-07-02 – 2021-07-03 (×2): 20 meq via ORAL
  Filled 2021-07-02 (×2): qty 2

## 2021-07-02 MED ORDER — SODIUM CHLORIDE 0.9% FLUSH
3.0000 mL | Freq: Two times a day (BID) | INTRAVENOUS | Status: DC
  Administered 2021-07-02 – 2021-07-03 (×2): 3 mL via INTRAVENOUS

## 2021-07-02 MED ORDER — ISOSORBIDE MONONITRATE ER 30 MG PO TB24
30.0000 mg | ORAL_TABLET | Freq: Every day | ORAL | Status: DC
  Administered 2021-07-02 – 2021-07-03 (×2): 30 mg via ORAL
  Filled 2021-07-02 (×2): qty 1

## 2021-07-02 MED ORDER — HEPARIN BOLUS VIA INFUSION
2500.0000 [IU] | Freq: Once | INTRAVENOUS | Status: AC
  Administered 2021-07-02: 2500 [IU] via INTRAVENOUS
  Filled 2021-07-02: qty 2500

## 2021-07-02 NOTE — Progress Notes (Signed)
ANTICOAGULATION CONSULT NOTE -follow up  Pharmacy Consult for heparin Indication: chest pain/ACS  Allergies  Allergen Reactions   Penicillins Hives    As child    Patient Measurements: Height: 5\' 6"  (167.6 cm) Weight: 106.3 kg (234 lb 4.8 oz) IBW/kg (Calculated) : 63.8 Heparin Dosing Weight: 89.4 kg  Vital Signs: Temp: 97.5 F (36.4 C) (01/11 1134) Temp Source: Oral (01/11 1134) BP: 122/79 (01/11 1134) Pulse Rate: 76 (01/11 1134)  Labs: Recent Labs    07/01/21 1136 07/01/21 1516 07/01/21 2015 07/01/21 2124 07/02/21 0328 07/02/21 1140  HGB 14.7  --   --   --  14.1  --   HCT 43.8  --   --   --  42.5  --   PLT PLATELET CLUMPS NOTED ON SMEAR, UNABLE TO ESTIMATE  --   --   --  228  --   HEPARINUNFRC  --   --  0.21*  --  <0.10* 0.16*  CREATININE 0.99  --   --   --  1.08  --   TROPONINIHS 2,486* 1,249* 2,240* 1,971*  --   --      Estimated Creatinine Clearance: 100.8 mL/min (by C-G formula based on SCr of 1.08 mg/dL).  Assessment: Pharmacy consulted to dose heparin for elevated troponin, r/o ACS.   Hg WNL, PLT clumped, SCr 0.99. Troponin 2486> 1249.  HL undetectable early this morning. >  IV moved to forearm to prevent occlusions and heparin rate increased.  The next  heparin level is up to 0.16, sub-therapeutic  after re-bolus heparin &  increased heparin gtt  to 1800 units/hr.  CBC within normal limits/ stable. No issues with IV and no bleeding noted per discussion with RN.   Goal of Therapy:  Heparin level 0.3-0.7 units/ml Monitor platelets by anticoagulation protocol: Yes   Plan:  Rebolus heparin 2500 units x1 Increase heparin drip to 2100 units/hr Check heparin level in 6 hours Daily HL and CBC.   Thank you for allowing pharmacy to be part of this patients care team.  10-06-1973, RPh Clinical Pharmacist [phone] Please see amion for complete clinical pharmacist phone list 07/02/2021 12:43 PM

## 2021-07-02 NOTE — TOC Benefit Eligibility Note (Signed)
Patient Product/process development scientist completed.    The patient is currently admitted and upon discharge could be taking Farxiga 10 mg.  Prior Authorization Required  The patient is insured through [place] [place] [employer]     Roland Earl, CPhT Pharmacy Patient Advocate Specialist [facility] Patient Advocate Team Direct Number: [phone]  Fax: [phone]

## 2021-07-02 NOTE — Plan of Care (Signed)
  Problem: Education: Goal: Knowledge of General Education information will improve Description: Including pain rating scale, medication(s)/side effects and non-pharmacologic comfort measures Outcome: Progressing   Problem: Health Behavior/Discharge Planning: Goal: Ability to manage health-related needs will improve Outcome: Progressing   Problem: Clinical Measurements: Goal: Ability to maintain clinical measurements within normal limits will improve Outcome: Progressing   Problem: Clinical Measurements: Goal: Cardiovascular complication will be avoided Outcome: Progressing   

## 2021-07-02 NOTE — Progress Notes (Signed)
DAILY PROGRESS NOTE   Patient Name: John Olsen Date of Encounter: 07/02/2021 Cardiologist: Armanda Magic, MD  Chief Complaint   Breathing better  Patient Profile   46 year old male with chronic systolic heart failure, LVEF 40-45%, left bundle branch block and hypertension, presents with progressive shortness of breath and acute on chronic systolic congestive heart failure.  Subjective   Diuresed 2L negative overnight. Breathing is better. Troponin is up and down - no chest pain.  Suspect demand ischemia. No CAD in 2019 by cath, so suspicion this is ACS seems lower. LDL 53. Echo is pending. Weight down to 106 kg from 112 kg. Notes at home he would drink water on and off through the night - seems that water intake may be high.  Objective   Vitals:   07/02/21 0200 07/02/21 0344 07/02/21 0523 07/02/21 0755  BP: (!) 132/95 (!) 143/104 (!) 141/99   Pulse: 76 79    Resp: 20 (!) 22 20   Temp:  (!) 97.4 F (36.3 C)  98.5 F (36.9 C)  TempSrc:    Oral  SpO2: 94% 95% 95%   Weight:  106.3 kg    Height:        Intake/Output Summary (Last 24 hours) at 07/02/2021 1610 Last data filed at 07/02/2021 0345 Gross per 24 hour  Intake 414.57 ml  Output 2600 ml  Net -2185.43 ml   Filed Weights   07/01/21 1109 07/01/21 1921 07/02/21 0344  Weight: 112 kg 107.9 kg 106.3 kg    Physical Exam   General appearance: alert and no distress Neck: JVD - 3 cm above sternal notch, no carotid bruit, and thyroid not enlarged, symmetric, no tenderness/mass/nodules Lungs: diminished breath sounds bibasilar Heart: regular rate and rhythm Abdomen: soft, non-tender; bowel sounds normal; no masses,  no organomegaly and obese Extremities: edema 1+ bilateral pedal Pulses: 2+ and symmetric Skin: chronic LE venous stasis changes Neurologic: Grossly normal Psych: Pleasant  Inpatient Medications    Scheduled Meds:  aspirin EC  81 mg Oral Daily   carvedilol  12.5 mg Oral BID WC   furosemide  40 mg  Intravenous Daily   losartan  100 mg Oral Daily   potassium chloride  10 mEq Oral Daily   spironolactone  25 mg Oral Daily   Vitamin D (Ergocalciferol)  50,000 Units Oral Weekly    Continuous Infusions:  heparin 1,800 Units/hr (07/02/21 0529)   nitroGLYCERIN 25 mcg/min (07/02/21 0526)    PRN Meds: acetaminophen, nitroGLYCERIN, ondansetron (ZOFRAN) IV   Labs   Results for orders placed or performed during the hospital encounter of 07/01/21 (from the past 48 hour(s))  Brain natriuretic peptide     Status: Abnormal   Collection Time: 07/01/21 11:36 AM  Result Value Ref Range   B Natriuretic Peptide 688.0 (H) 0.0 - 100.0 pg/mL    Comment: Performed at [facility], [address] [place], [place] [address]  Comprehensive metabolic panel     Status: Abnormal   Collection Time: 07/01/21 11:36 AM  Result Value Ref Range   Sodium 136 135 - 145 mmol/L   Potassium 4.3 3.5 - 5.1 mmol/L    Comment: SLIGHT HEMOLYSIS   Chloride 106 98 - 111 mmol/L   CO2 24 22 - 32 mmol/L   Glucose, Bld 158 (H) 70 - 99 mg/dL    Comment: Glucose reference range applies only to samples taken after fasting for at least 8 hours.   BUN 19 6 - 20 mg/dL  Creatinine, Ser 0.99 0.61 - 1.24 mg/dL   Calcium 8.1 (L) 8.9 - 10.3 mg/dL   Total Protein 6.4 (L) 6.5 - 8.1 g/dL   Albumin 3.7 3.5 - 5.0 g/dL   AST 76 (H) 15 - 41 U/L   ALT 91 (H) 0 - 44 U/L   Alkaline Phosphatase 77 38 - 126 U/L   Total Bilirubin 1.7 (H) 0.3 - 1.2 mg/dL   GFR, Estimated >16 >10 mL/min    Comment: (NOTE) Calculated using the CKD-EPI Creatinine Equation (2021)    Anion gap 6 5 - 15    Comment: Performed at [facility], [address] [place], [place] [address]  CBC with Differential     Status: None   Collection Time: 07/01/21 11:36 AM  Result Value Ref Range   WBC 8.3 4.0 - 10.5 K/uL   RBC 4.81 4.22 - 5.81 MIL/uL   Hemoglobin 14.7 13.0 - 17.0 g/dL   HCT 40.9 81.1 - 91.4 %   MCV 91.1 80.0 -  100.0 fL   MCH 30.6 26.0 - 34.0 pg   MCHC 33.6 30.0 - 36.0 g/dL   RDW 78.2 95.6 - 21.3 %   Platelets PLATELET CLUMPS NOTED ON SMEAR, UNABLE TO ESTIMATE 150 - 400 K/uL   nRBC 0.0 0.0 - 0.2 %   Neutrophils Relative % 75 %   Neutro Abs 6.2 1.7 - 7.7 K/uL   Lymphocytes Relative 16 %   Lymphs Abs 1.3 0.7 - 4.0 K/uL   Monocytes Relative 7 %   Monocytes Absolute 0.5 0.1 - 1.0 K/uL   Eosinophils Relative 1 %   Eosinophils Absolute 0.1 0.0 - 0.5 K/uL   Basophils Relative 1 %   Basophils Absolute 0.1 0.0 - 0.1 K/uL   Immature Granulocytes 0 %   Abs Immature Granulocytes 0.02 0.00 - 0.07 K/uL    Comment: Performed at [facility], [address], [place], [place] [address]  Troponin I (High Sensitivity)     Status: Abnormal   Collection Time: 07/01/21 11:36 AM  Result Value Ref Range   Troponin I (High Sensitivity) 2,486 (HH) <18 ng/L    Comment: CRITICAL RESULT CALLED TO, READ BACK BY AND VERIFIED WITH: BRATU,D. EMTP AT 1445 07/01/21 MULLINS,T (NOTE) Elevated high sensitivity troponin I (hsTnI) values and significant  changes across serial measurements may suggest ACS but many other  chronic and acute conditions are known to elevate hsTnI results.  Refer to the Links section for chest pain algorithms and additional  guidance. Performed at [facility], [address], [place], [place] [address]   Resp Panel by RT-PCR (Flu A&B, Covid) Nasopharyngeal Swab     Status: None   Collection Time: 07/01/21 12:34 PM   Specimen: Nasopharyngeal Swab; Nasopharyngeal(NP) swabs in vial transport medium  Result Value Ref Range   SARS Coronavirus 2 by RT PCR NEGATIVE NEGATIVE    Comment: (NOTE) SARS-CoV-2 target nucleic acids are NOT DETECTED.  The SARS-CoV-2 RNA is generally detectable in upper respiratory specimens during the acute phase of infection. The lowest concentration of SARS-CoV-2 viral copies this assay can detect is 138 copies/mL. A negative result  does not preclude SARS-Cov-2 infection and should not be used as the sole basis for treatment or other patient management decisions. A negative result may occur with  improper specimen collection/handling, submission of specimen other than nasopharyngeal swab, presence of viral mutation(s) within the areas targeted by this assay, and inadequate number of viral copies(<138 copies/mL).  A negative result must be combined with clinical observations, patient history, and epidemiological information. The expected result is Negative.  Fact Sheet for Patients:  BloggerCourse.com  Fact Sheet for Healthcare Providers:  SeriousBroker.it  This test is no t yet approved or cleared by the [place] FDA and  has been authorized for detection and/or diagnosis of SARS-CoV-2 by FDA under an Emergency Use Authorization (EUA). This EUA will remain  in effect (meaning this test can be used) for the duration of the COVID-19 declaration under Section 564(b)(1) of the Act, 21 U.S.C.section 360bbb-3(b)(1), unless the authorization is terminated  or revoked sooner.       Influenza A by PCR NEGATIVE NEGATIVE   Influenza B by PCR NEGATIVE NEGATIVE    Comment: (NOTE) The Xpert Xpress SARS-CoV-2/FLU/RSV plus assay is intended as an aid in the diagnosis of influenza from Nasopharyngeal swab specimens and should not be used as a sole basis for treatment. Nasal washings and aspirates are unacceptable for Xpert Xpress SARS-CoV-2/FLU/RSV testing.  Fact Sheet for Patients: BloggerCourse.com  Fact Sheet for Healthcare Providers: SeriousBroker.it  This test is not yet approved or cleared by the [place] FDA and has been authorized for detection and/or diagnosis of SARS-CoV-2 by FDA under an Emergency Use Authorization (EUA). This EUA will remain in effect (meaning this test can be used) for the duration  of the COVID-19 declaration under Section 564(b)(1) of the Act, 21 U.S.C. section 360bbb-3(b)(1), unless the authorization is terminated or revoked.  Performed at [facility], [address] [place], [place] [address]   Troponin I (High Sensitivity)     Status: Abnormal   Collection Time: 07/01/21  3:16 PM  Result Value Ref Range   Troponin I (High Sensitivity) 1,249 (HH) <18 ng/L    Comment: DELTA CHECK NOTED CRITICAL VALUE NOTED.  VALUE IS CONSISTENT WITH PREVIOUSLY REPORTED AND CALLED VALUE. (NOTE) Elevated high sensitivity troponin I (hsTnI) values and significant  changes across serial measurements may suggest ACS but many other  chronic and acute conditions are known to elevate hsTnI results.  Refer to the Links section for chest pain algorithms and additional  guidance. Performed at [facility], [address] [place], [place] [address]   Heparin level (unfractionated)     Status: Abnormal   Collection Time: 07/01/21  8:15 PM  Result Value Ref Range   Heparin Unfractionated 0.21 (L) 0.30 - 0.70 IU/mL    Comment: (NOTE) The clinical reportable range upper limit is being lowered to >1.10 to align with the FDA approved guidance for the current laboratory assay.  If heparin results are below expected values, and patient dosage has  been confirmed, suggest follow up testing of antithrombin III levels. Performed at [facility], [address] [place], [place] [address]   HIV Antibody (routine testing w rflx)     Status: None   Collection Time: 07/01/21  8:15 PM  Result Value Ref Range   HIV Screen 4th Generation wRfx Non Reactive Non Reactive    Comment: Performed at [facility], [address], [place], [place] [address]  Troponin I (High Sensitivity)     Status: Abnormal   Collection Time: 07/01/21  8:15 PM  Result Value Ref Range   Troponin I (High Sensitivity) 2,240 (HH) <18 ng/L    Comment: CRITICAL RESULT  CALLED TO, READ BACK BY AND VERIFIED WITH: DALLAS HART RN 07/01/21 2129 M KOROLESKI (NOTE) Elevated high sensitivity troponin I (hsTnI) values  and significant  changes across serial measurements may suggest ACS but many other  chronic and acute conditions are known to elevate hsTnI results.  Refer to the Links section for chest pain algorithms and additional  guidance. Performed at [facility], [address] [place], [place] [address]   TSH     Status: None   Collection Time: 07/01/21  8:15 PM  Result Value Ref Range   TSH 3.589 0.350 - 4.500 uIU/mL    Comment: Performed by a 3rd Generation assay with a functional sensitivity of <=0.01 uIU/mL. Performed at [facility], [address] [place], [place] [address]   Hemoglobin A1c     Status: Abnormal   Collection Time: 07/01/21  8:15 PM  Result Value Ref Range   Hgb A1c MFr Bld 6.9 (H) 4.8 - 5.6 %    Comment: (NOTE) Pre diabetes:          5.7%-6.4%  Diabetes:              >6.4%  Glycemic control for   <7.0% adults with diabetes    Mean Plasma Glucose 151.33 mg/dL    Comment: Performed at [facility], [address], [place], [place] [address]  Troponin I (High Sensitivity)     Status: Abnormal   Collection Time: 07/01/21  9:24 PM  Result Value Ref Range   Troponin I (High Sensitivity) 1,971 (HH) <18 ng/L    Comment: CRITICAL VALUE NOTED.  VALUE IS CONSISTENT WITH PREVIOUSLY REPORTED AND CALLED VALUE. (NOTE) Elevated high sensitivity troponin I (hsTnI) values and significant  changes across serial measurements may suggest ACS but many other  chronic and acute conditions are known to elevate hsTnI results.  Refer to the Links section for chest pain algorithms and additional  guidance. Performed at [facility], [address] [place], [place] [address]   CBC     Status: None   Collection Time: 07/02/21  3:28 AM  Result Value Ref Range   WBC 7.5 4.0 - 10.5 K/uL   RBC 4.66 4.22 - 5.81 MIL/uL    Hemoglobin 14.1 13.0 - 17.0 g/dL   HCT 21.9 75.8 - 83.2 %   MCV 91.2 80.0 - 100.0 fL   MCH 30.3 26.0 - 34.0 pg   MCHC 33.2 30.0 - 36.0 g/dL   RDW 54.9 82.6 - 41.5 %   Platelets 228 150 - 400 K/uL   nRBC 0.0 0.0 - 0.2 %    Comment: Performed at [facility], [address] [place], [place] [address]  Basic metabolic panel     Status: Abnormal   Collection Time: 07/02/21  3:28 AM  Result Value Ref Range   Sodium 138 135 - 145 mmol/L   Potassium 3.7 3.5 - 5.1 mmol/L   Chloride 102 98 - 111 mmol/L   CO2 25 22 - 32 mmol/L   Glucose, Bld 118 (H) 70 - 99 mg/dL    Comment: Glucose reference range applies only to samples taken after fasting for at least 8 hours.   BUN 16 6 - 20 mg/dL   Creatinine, Ser 0.76 0.61 - 1.24 mg/dL   Calcium 8.1 (L) 8.9 - 10.3 mg/dL   GFR, Estimated >80 >88 mL/min    Comment: (NOTE) Calculated using the CKD-EPI Creatinine Equation (2021)    Anion gap 11 5 - 15    Comment: Performed at [facility], [address] [place], [place] [address]  Lipid panel     Status: Abnormal   Collection Time: 07/02/21  3:28 AM  Result Value Ref Range   Cholesterol 102 0 - 200 mg/dL   Triglycerides 85 <416 mg/dL   HDL 32 (L) >60 mg/dL   Total CHOL/HDL Ratio 3.2 RATIO   VLDL 17 0 - 40 mg/dL   LDL Cholesterol 53 0 - 99 mg/dL    Comment:        Total Cholesterol/HDL:CHD Risk Coronary Heart Disease Risk Table                     Men   Women  1/2 Average Risk   3.4   3.3  Average Risk       5.0   4.4  2 X Average Risk   9.6   7.1  3 X Average Risk  23.4   11.0        Use the calculated Patient Ratio above and the CHD Risk Table to determine the patient's CHD Risk.        ATP III CLASSIFICATION (LDL):  <100     mg/dL   Optimal  630-160  mg/dL   Near or Above                    Optimal  130-159  mg/dL   Borderline  109-323  mg/dL   High  >557     mg/dL   Very High Performed at [facility], [address], [place], [place] [address]   Heparin  level (unfractionated)     Status: Abnormal   Collection Time: 07/02/21  3:28 AM  Result Value Ref Range   Heparin Unfractionated <0.10 (L) 0.30 - 0.70 IU/mL    Comment: (NOTE) The clinical reportable range upper limit is being lowered to >1.10 to align with the FDA approved guidance for the current laboratory assay.  If heparin results are below expected values, and patient dosage has  been confirmed, suggest follow up testing of antithrombin III levels. Performed at [facility], [address] [place], [place] [address]     ECG   N/A  Telemetry   Sinus with LBBB - Personally Reviewed  Radiology    DG Chest 2 View  Result Date: 07/01/2021 CLINICAL DATA:  Shortness of breath EXAM: CHEST - 2 VIEW COMPARISON:  Chest radiograph 02/24/2020 FINDINGS: Heart is markedly enlarged, unchanged. The mediastinal contours are stable. Enlargement of the pulmonary vasculature particularly on the right suggests pulmonary hypertension. There is central vascular congestion without evidence of overt pulmonary edema. There is no focal consolidation. There is no pleural effusion or pneumothorax. There is no acute osseous abnormality. Abdominal surgical clips are noted. IMPRESSION: Cardiomegaly with vascular congestion but no definite overt pulmonary edema. Electronically Signed   By: Lesia Hausen M.D.   On: 07/01/2021 11:42    Cardiac Studies   Echo pending  Assessment   Principal Problem:   Acute combined systolic and diastolic congestive heart failure (HCC) Active Problems:   LBBB (left bundle branch block)   DCM (dilated cardiomyopathy) (HCC)   Essential hypertension   Demand ischemia (HCC)   NSTEMI (non-ST elevated myocardial infarction) (HCC)   Plan   Good diuresis overnight -breathing improved. Troponin is up and down, more consistent with demand ischemia. He had no CAD by cath in 2019. Will not pursue cath. Stop IV nitro and transition to imdur 30 mg daily. May add  hydralazine as well. Echo  pending. Consider adding [place]. Could not tolerate Entresto, therefore on max dose losartan. Would restrict water intake to 1.5L/day. Continue heparin another 24 hours and then will discontinue.  Time Spent Directly with Patient:  I have spent a total of 35 minutes with the patient reviewing hospital notes, telemetry, EKGs, labs and examining the patient as well as establishing an assessment and plan that was discussed personally with the patient.  > 50% of time was spent in direct patient care.  Length of Stay:  LOS: 1 day   Chrystie Nose, MD, [facility], FACP  [place]   [facility]  Medical Director of the Advanced Lipid Disorders &  Cardiovascular Risk Reduction Clinic Diplomate of the American Board of Clinical Lipidology Attending Cardiologist  Direct Dial: [phone]   Fax: [phone]  Website:  [email or web address]  Lisette Abu Fani Rotondo 07/02/2021, 8:22 AM

## 2021-07-02 NOTE — Progress Notes (Signed)
° °  Personally reviewed echo and discussed with reader, and patient's primary cardiologist Dr. Mayford Knife about the results - the LVEF is lower at 30-35% with clear regional WMA's, especially inferiorly. This is concerning for ischemia or less likely myocarditis in the setting of elevated troponin. It should be noted he had no troponin elevation prior to cath in 2019, when he was found to have NICM and LVEF 20%.  Based on these findings, we both agree that repeat left heart catheterization is warranted, despite the relatively short time it has been since his cath in 2019. The last cath was performed by Dr. Katrinka Blazing.  CARDIAC CATHETERIZATION CONSENT  Performing MD:  Dr. Katrinka Blazing  Procedure:  Left heart catheterization, possible percutaneous intervention  The procedure with Risks/Benefits/Alternatives and Indications was reviewed with the patient. All questions were answered.    Risks / Complications include, but not limited to: Death, MI, CVA/TIA, VF/VT (with defibrillation), Bradycardia (need for temporary pacer placement), contrast induced nephropathy, bleeding / bruising / hematoma / pseudoaneurysm, vascular or coronary injury (with possible emergent CT or Vascular Surgery), adverse medication reactions, infection.    The patient voices understanding and agreed to proceed.    Chrystie Nose, MD, [facility], FACP  [place]   [facility]  Medical Director of the Advanced Lipid Disorders &  Cardiovascular Risk Reduction Clinic Diplomate of the American Board of Clinical Lipidology Attending Cardiologist  Direct Dial: [phone]   Fax: [phone]  Website:  [email or web address] 07/02/2021, 4:23 PM

## 2021-07-02 NOTE — Progress Notes (Signed)
ANTICOAGULATION CONSULT NOTE -follow up  Pharmacy Consult for heparin Indication: chest pain/ACS  Allergies  Allergen Reactions   Penicillins Hives    As child    Patient Measurements: Height: 5\' 6"  (167.6 cm) Weight: 106.3 kg (234 lb 4.8 oz) IBW/kg (Calculated) : 63.8 Heparin Dosing Weight: 89.4 kg  Vital Signs: Temp: 97.8 F (36.6 C) (01/11 1940) Temp Source: Other (Comment) (01/11 1940) BP: 129/73 (01/11 1940) Pulse Rate: 87 (01/11 1940)  Labs: Recent Labs    07/01/21 1136 07/01/21 1516 07/01/21 2015 07/01/21 2015 07/01/21 2124 07/02/21 0328 07/02/21 1140 07/02/21 1930  HGB 14.7  --   --   --   --  14.1  --   --   HCT 43.8  --   --   --   --  42.5  --   --   PLT PLATELET CLUMPS NOTED ON SMEAR, UNABLE TO ESTIMATE  --   --   --   --  228  --   --   HEPARINUNFRC  --   --  0.21*   < >  --  <0.10* 0.16* 0.31  CREATININE 0.99  --   --   --   --  1.08  --   --   TROPONINIHS 2,486* 1,249* 2,240*  --  1,971*  --   --   --    < > = values in this interval not displayed.     Estimated Creatinine Clearance: 100.8 mL/min (by C-G formula based on SCr of 1.08 mg/dL).  Assessment: Pharmacy consulted to dose heparin for elevated troponin, r/o ACS.   Hg WNL, PLT clumped, SCr 0.99. Troponin 2486> 1249.  HL undetectable early this morning. >  IV moved to forearm to prevent occlusions and heparin rate increased.  The next  heparin level is up to 0.16, sub-therapeutic  after re-bolus heparin &  increased heparin gtt  to 1800 units/hr.  CBC within normal limits/ stable. No issues with IV and no bleeding noted per discussion with RN.  HL came back in range but on the lower side. Will increase and check in AM Goal of Therapy:  Heparin level 0.3-0.7 units/ml Monitor platelets by anticoagulation protocol: Yes   Plan:  Increase heparin drip to 2250 units/hr Daily HL and CBC.   04-21-1987, PharmD, BCIDP, AAHIVP, CPP Infectious Disease Pharmacist 07/02/2021 8:37 PM

## 2021-07-02 NOTE — Progress Notes (Signed)
°  Echocardiogram 2D Echocardiogram has been performed.  John Olsen 07/02/2021, 11:46 AM

## 2021-07-02 NOTE — Progress Notes (Addendum)
Inpatient Diabetes Program Recommendations  AACE/ADA: New Consensus Statement on Inpatient Glycemic Control (2015)  Target Ranges:  Prepandial:   less than 140 mg/dL      Peak postprandial:   less than 180 mg/dL (1-2 hours)      Critically ill patients:  140 - 180 mg/dL   Lab Results  Component Value Date   HGBA1C 6.9 (H) 07/01/2021    Review of Glycemic Control  Latest Reference Range & Units 07/01/21 11:36 07/02/21 03:28  Glucose 70 - 99 mg/dL 389 (H) 373 (H)   Diabetes history: DM 2 Outpatient Diabetes medications:  None Current orders for Inpatient glycemic control:  None  Inpatient Diabetes Program Recommendations:   Of note, A1C in March of 2022 was 7.9% and has now dropped to 6.9%.  May benefit from SGLT-2 for DM? Will call patient to make sure he is aware of A1C.   Thanks,  Beryl Meager, RN, BC-ADM Inpatient Diabetes Coordinator Pager [phone]  (8a-5p)  Addendum [phone]- Spoke with patient by phone regarding current A1C of 6.9%.  He states he recalls in the past something being said about his blood sugars being elevated.  He states that this runs in his family.  Discussed that last March his A1C was higher and had actually improved.  Discussed importance of lifestyle modifications such as eliminating sugar from beverages and exercise that is approved by MD.  Ordered living well with DM booklet for patient as well.  He is appreciative of information and plans to f/u with PCP.

## 2021-07-02 NOTE — H&P (Signed)
NICM with EF < 20%. Normal CA 2019 Now NSTEMI by TI elevation Current EF 30-35% with RWMA in setting LBBB.

## 2021-07-02 NOTE — Progress Notes (Signed)
ANTICOAGULATION CONSULT NOTE - Follow Up Consult  Pharmacy Consult for heparin Indication:  NSTEMI vs demand ischemia  Labs: Recent Labs    07/01/21 1136 07/01/21 1516 07/01/21 2015 07/01/21 2124 07/02/21 0328  HGB 14.7  --   --   --  14.1  HCT 43.8  --   --   --  42.5  PLT PLATELET CLUMPS NOTED ON SMEAR, UNABLE TO ESTIMATE  --   --   --  228  HEPARINUNFRC  --   --  0.21*  --  <0.10*  CREATININE 0.99  --   --   --  1.08  TROPONINIHS 2,486* 1,249* 2,240* 1,971*  --     Assessment: 45yo male subtherapeutic on heparin with lower heparin level despite increased rate; no signs of bleeding per RN, line had been occluded a few times overnight when UFH was running through [facility] but now new IV line established in forearm with no infusion issues since.  Goal of Therapy:  Heparin level 0.3-0.7 units/ml   Plan:  Will rebolus with heparin 3000 units and increase heparin infusion by 4 units/kg/hr to 1800 units/hr and check level in 6 hours.    Vernard Gambles, PharmD, BCPS  07/02/2021,5:11 AM

## 2021-07-02 NOTE — TOC Progression Note (Signed)
Transition of Care [facility]) - Progression Note    Patient Details  Name: John Olsen MRN: [id number] Date of Birth: 02-09-1976  Transition of Care [facility]) CM/SW Contact  Leone Haven, RN Phone Number: 07/02/2021, 9:38 AM  Clinical Narrative:     Transition of Care [facility]) Screening Note   Patient Details  Name: John Olsen Date of Birth: 1975-12-18   Transition of Care [facility]) CM/SW Contact:    Leone Haven, RN Phone Number: 07/02/2021, 9:38 AM    Transition of Care Department [facility]) has reviewed patient and no TOC needs have been identified at this time. We will continue to monitor patient advancement through interdisciplinary progression rounds. If new patient transition needs arise, please place a TOC consult.          Expected Discharge Plan and Services                                                 Social Determinants of Health (SDOH) Interventions    Readmission Risk Interventions No flowsheet data found.

## 2021-07-03 ENCOUNTER — Encounter (HOSPITAL_COMMUNITY)
Admission: EM | Disposition: A | Discharge status: Home / Self Care | Payer: Self-pay | Source: Home / Self Care | Attending: Internal Medicine

## 2021-07-03 ENCOUNTER — Other Ambulatory Visit (HOSPITAL_COMMUNITY): Payer: Self-pay

## 2021-07-03 ENCOUNTER — Encounter (HOSPITAL_COMMUNITY): Payer: Self-pay | Admitting: Interventional Cardiology

## 2021-07-03 DIAGNOSIS — I10 Essential (primary) hypertension: Secondary | ICD-10-CM | POA: Diagnosis not present

## 2021-07-03 DIAGNOSIS — I447 Left bundle-branch block, unspecified: Secondary | ICD-10-CM | POA: Diagnosis not present

## 2021-07-03 DIAGNOSIS — I420 Dilated cardiomyopathy: Secondary | ICD-10-CM | POA: Diagnosis not present

## 2021-07-03 DIAGNOSIS — I248 Other forms of acute ischemic heart disease: Secondary | ICD-10-CM | POA: Diagnosis not present

## 2021-07-03 DIAGNOSIS — R778 Other specified abnormalities of plasma proteins: Secondary | ICD-10-CM

## 2021-07-03 DIAGNOSIS — I5041 Acute combined systolic (congestive) and diastolic (congestive) heart failure: Secondary | ICD-10-CM | POA: Diagnosis not present

## 2021-07-03 HISTORY — PX: LEFT HEART CATH AND CORONARY ANGIOGRAPHY: CATH118249

## 2021-07-03 LAB — BASIC METABOLIC PANEL
Anion gap: 13 (ref 5–15)
BUN: 16 mg/dL (ref 6–20)
CO2: 21 mmol/L — ABNORMAL LOW (ref 22–32)
Calcium: 8.8 mg/dL — ABNORMAL LOW (ref 8.9–10.3)
Chloride: 102 mmol/L (ref 98–111)
Creatinine, Ser: 0.87 mg/dL (ref 0.61–1.24)
GFR, Estimated: >60 mL/min (ref >60)
Glucose, Bld: 157 mg/dL — ABNORMAL HIGH (ref 70–99)
Potassium: 4.3 mmol/L (ref 3.5–5.1)
Sodium: 136 mmol/L (ref 135–145)

## 2021-07-03 LAB — CBC
HCT: 44.3 % (ref 39.0–52.0)
Hemoglobin: 14.6 g/dL (ref 13.0–17.0)
MCH: 30.2 pg (ref 26.0–34.0)
MCHC: 33 g/dL (ref 30.0–36.0)
MCV: 91.7 fL (ref 80.0–100.0)
Platelets: 235 10*3/uL (ref 150–400)
RBC: 4.83 MIL/uL (ref 4.22–5.81)
RDW: 13.3 % (ref 11.5–15.5)
WBC: 8 10*3/uL (ref 4.0–10.5)
nRBC: 0 % (ref 0.0–0.2)

## 2021-07-03 LAB — HEPARIN LEVEL (UNFRACTIONATED): Heparin Unfractionated: 0.6 IU/mL (ref 0.30–0.70)

## 2021-07-03 LAB — MAGNESIUM: Magnesium: 2.2 mg/dL (ref 1.7–2.4)

## 2021-07-03 SURGERY — LEFT HEART CATH AND CORONARY ANGIOGRAPHY
Anesthesia: LOCAL

## 2021-07-03 MED ORDER — LIDOCAINE HCL (PF) 1 % IJ SOLN
INTRAMUSCULAR | Status: AC
  Filled 2021-07-03: qty 30

## 2021-07-03 MED ORDER — ISOSORBIDE MONONITRATE ER 30 MG PO TB24
30.0000 mg | ORAL_TABLET | Freq: Every day | ORAL | 3 refills | Status: DC
  Filled 2021-07-03: qty 90, 90d supply, fill #0

## 2021-07-03 MED ORDER — HEPARIN SODIUM (PORCINE) 1000 UNIT/ML IJ SOLN
INTRAMUSCULAR | Status: AC
  Filled 2021-07-03: qty 10

## 2021-07-03 MED ORDER — ONDANSETRON HCL 4 MG/2ML IJ SOLN: 4.0000 mg | Freq: Four times a day (QID) | INTRAMUSCULAR | Status: DC | PRN

## 2021-07-03 MED ORDER — SODIUM CHLORIDE 0.9 % IV SOLN: 250.0000 mL | INTRAVENOUS | Status: DC | PRN

## 2021-07-03 MED ORDER — FENTANYL CITRATE (PF) 100 MCG/2ML IJ SOLN
INTRAMUSCULAR | Status: DC | PRN
  Administered 2021-07-03: 25 ug via INTRAVENOUS

## 2021-07-03 MED ORDER — VERAPAMIL HCL 2.5 MG/ML IV SOLN
INTRAVENOUS | Status: AC
  Filled 2021-07-03: qty 2

## 2021-07-03 MED ORDER — IOHEXOL 350 MG/ML SOLN
INTRAVENOUS | Status: DC | PRN
  Administered 2021-07-03: 90 mL

## 2021-07-03 MED ORDER — HYDRALAZINE HCL 20 MG/ML IJ SOLN: 10.0000 mg | INTRAMUSCULAR | Status: AC | PRN

## 2021-07-03 MED ORDER — SODIUM CHLORIDE 0.9% FLUSH
3.0000 mL | Freq: Two times a day (BID) | INTRAVENOUS | Status: DC
  Administered 2021-07-03: 3 mL via INTRAVENOUS

## 2021-07-03 MED ORDER — ACETAMINOPHEN 325 MG PO TABS: 650.0000 mg | ORAL_TABLET | ORAL | Status: DC | PRN

## 2021-07-03 MED ORDER — VERAPAMIL HCL 2.5 MG/ML IV SOLN
INTRAVENOUS | Status: DC | PRN
  Administered 2021-07-03: 10 mL via INTRA_ARTERIAL

## 2021-07-03 MED ORDER — HEPARIN (PORCINE) IN NACL 1000-0.9 UT/500ML-% IV SOLN
INTRAVENOUS | Status: AC
  Filled 2021-07-03: qty 1000

## 2021-07-03 MED ORDER — SODIUM CHLORIDE 0.9% FLUSH: 3.0000 mL | INTRAVENOUS | Status: DC | PRN

## 2021-07-03 MED ORDER — DAPAGLIFLOZIN PROPANEDIOL 10 MG PO TABS: 10.0000 mg | ORAL_TABLET | Freq: Every day | ORAL | Status: DC

## 2021-07-03 MED ORDER — MIDAZOLAM HCL 2 MG/2ML IJ SOLN
INTRAMUSCULAR | Status: DC | PRN
  Administered 2021-07-03: 1 mg via INTRAVENOUS

## 2021-07-03 MED ORDER — HEPARIN SODIUM (PORCINE) 1000 UNIT/ML IJ SOLN
INTRAMUSCULAR | Status: DC | PRN
  Administered 2021-07-03: 5000 [IU] via INTRAVENOUS

## 2021-07-03 MED ORDER — HEPARIN SODIUM (PORCINE) 5000 UNIT/ML IJ SOLN: 5000.0000 [IU] | Freq: Three times a day (TID) | INTRAMUSCULAR | Status: DC

## 2021-07-03 MED ORDER — SODIUM CHLORIDE 0.9 % IV SOLN: INTRAVENOUS | Status: AC

## 2021-07-03 MED ORDER — OXYCODONE HCL 5 MG PO TABS: 5.0000 mg | ORAL_TABLET | ORAL | Status: DC | PRN

## 2021-07-03 MED ORDER — FENTANYL CITRATE (PF) 100 MCG/2ML IJ SOLN
INTRAMUSCULAR | Status: AC
  Filled 2021-07-03: qty 2

## 2021-07-03 MED ORDER — CARVEDILOL 12.5 MG PO TABS
12.5000 mg | ORAL_TABLET | Freq: Two times a day (BID) | ORAL | 3 refills | Status: DC
  Filled 2021-07-03: qty 180, 90d supply, fill #0

## 2021-07-03 MED ORDER — HEPARIN (PORCINE) IN NACL 1000-0.9 UT/500ML-% IV SOLN
INTRAVENOUS | Status: DC | PRN
  Administered 2021-07-03 (×2): 500 mL

## 2021-07-03 MED ORDER — NITROGLYCERIN 0.4 MG SL SUBL
0.4000 mg | SUBLINGUAL_TABLET | SUBLINGUAL | 3 refills | Status: AC | PRN
  Filled 2021-07-03: qty 25, 7d supply, fill #0

## 2021-07-03 MED ORDER — LABETALOL HCL 5 MG/ML IV SOLN: 10.0000 mg | INTRAVENOUS | Status: AC | PRN

## 2021-07-03 MED ORDER — DAPAGLIFLOZIN PROPANEDIOL 10 MG PO TABS
10.0000 mg | ORAL_TABLET | Freq: Every day | ORAL | 3 refills | Status: DC
Start: 2021-07-04 — End: 2022-08-21
  Filled 2021-07-03: qty 30, 30d supply, fill #0

## 2021-07-03 MED ORDER — LIDOCAINE HCL (PF) 1 % IJ SOLN
INTRAMUSCULAR | Status: DC | PRN
  Administered 2021-07-03: 2 mL

## 2021-07-03 MED ORDER — ASPIRIN 81 MG PO TBEC
81.0000 mg | DELAYED_RELEASE_TABLET | Freq: Every day | ORAL | 3 refills | Status: DC
  Filled 2021-07-03: qty 90, 90d supply, fill #0

## 2021-07-03 MED ORDER — MIDAZOLAM HCL 2 MG/2ML IJ SOLN
INTRAMUSCULAR | Status: AC
  Filled 2021-07-03: qty 2

## 2021-07-03 MED ORDER — FUROSEMIDE 40 MG PO TABS: 40.0000 mg | ORAL_TABLET | Freq: Every day | ORAL | Status: DC

## 2021-07-03 SURGICAL SUPPLY — 13 items
CATH 5FR JL3.5 JR4 ANG PIG MP (CATHETERS) ×1 IMPLANT
CATH LAUNCHER 5F EBU3.0 (CATHETERS) IMPLANT
CATH LAUNCHER 5F EBU3.5 (CATHETERS) ×1 IMPLANT
CATHETER LAUNCHER 5F EBU3.0 (CATHETERS) ×2
DEVICE RAD COMP TR BAND LRG (VASCULAR PRODUCTS) ×1 IMPLANT
GLIDESHEATH SLEND A-KIT 6F 22G (SHEATH) ×1 IMPLANT
GUIDEWIRE INQWIRE 1.5J.035X260 (WIRE) IMPLANT
INQWIRE 1.5J .035X260CM (WIRE) ×2
KIT HEART LEFT (KITS) ×2 IMPLANT
PACK CARDIAC CATHETERIZATION (CUSTOM PROCEDURE TRAY) ×2 IMPLANT
SHEATH PROBE COVER 6X72 (BAG) ×1 IMPLANT
TRANSDUCER W/STOPCOCK (MISCELLANEOUS) ×2 IMPLANT
TUBING CIL FLEX 10 FLL-RA (TUBING) ×2 IMPLANT

## 2021-07-03 NOTE — TOC Progression Note (Signed)
Transition of Care [facility]) - Progression Note    Patient Details  Name: John Olsen MRN: [id number] Date of Birth: 1976/03/28  Transition of Care [facility]) CM/SW Contact  Leone Haven, RN Phone Number: 07/03/2021, 9:53 AM  Clinical Narrative:    From home with father, indep, CHF, demand ischemia, diallated cardiomyapathy, NSTEMI, for cardiac cath today, lasix change to po, he does not work but he still drives.  Will start on farixiga tomorrow, per Staff RN.  TOC will continue to follow for dc needs.    Expected Discharge Plan: Home/Self Care Barriers to Discharge: Continued Medical Work up  Expected Discharge Plan and Services Expected Discharge Plan: Home/Self Care   Discharge Planning Services: CM Consult Post Acute Care Choice: NA                     DME Agency: NA       HH Arranged: NA           Social Determinants of Health (SDOH) Interventions    Readmission Risk Interventions No flowsheet data found.

## 2021-07-03 NOTE — Interval H&P Note (Signed)
Cath Lab Visit (complete for each Cath Lab visit)  Clinical Evaluation Leading to the Procedure:   ACS: No.  Non-ACS:    Anginal Classification: CCS I  Anti-ischemic medical therapy: No Therapy  Non-Invasive Test Results: Intermediate-risk stress test findings: cardiac mortality 1-3%/year  Prior CABG: No previous CABG      History and Physical Interval Note:  07/03/2021 10:25 AM  John Olsen  has presented today for surgery, with the diagnosis of nstemi - elevated trop - new ef.  The various methods of treatment have been discussed with the patient and family. After consideration of risks, benefits and other options for treatment, the patient has consented to  Procedure(s): LEFT HEART CATH AND CORONARY ANGIOGRAPHY (N/A) as a surgical intervention.  The patient's history has been reviewed, patient examined, no change in status, stable for surgery.  I have reviewed the patient's chart and labs.  Questions were answered to the patient's satisfaction.     Belva Crome III

## 2021-07-03 NOTE — Progress Notes (Signed)
° °DAILY PROGRESS NOTE  ° °Patient Name: John Olsen °Date of Encounter: 07/03/2021 °Cardiologist: Traci Turner, MD ° °Chief Complaint  ° °No complaints ° °Patient Profile  ° °46-year-old male with chronic systolic heart failure, LVEF 40-45%, left bundle branch block and hypertension, presents with progressive shortness of breath and acute on chronic systolic congestive heart failure. ° °Subjective  ° °No issues overnight - plan for LHC today. Diuresis is slowing, only 500 cc negative. Creatinine has now normalized at 0.87.  ° °Objective  ° °Vitals:  ° 07/02/21 1940 07/03/21 0224 07/03/21 0400 07/03/21 0700  °BP: 129/73  (!) 128/100 (!) 153/105  °Pulse: 87  73 75  °Resp: (!) 22  18 (!) 21  °Temp: 97.8 °F (36.6 °C)  97.8 °F (36.6 °C) 97.9 °F (36.6 °C)  °TempSrc: Other (Comment)   Oral  °SpO2: 93%  97% 95%  °Weight:  107.5 kg    °Height:      ° ° °Intake/Output Summary (Last 24 hours) at 07/03/2021 0820 °Last data filed at 07/03/2021 0622 °Gross per 24 hour  °Intake 1560.29 ml  °Output 2000 ml  °Net -439.71 ml  ° °Filed Weights  ° 07/01/21 1921 07/02/21 0344 07/03/21 0224  °Weight: 107.9 kg 106.3 kg 107.5 kg  ° ° °Physical Exam  ° °General appearance: alert and no distress °Neck: JVD - 1 cm above sternal notch, no carotid bruit, and thyroid not enlarged, symmetric, no tenderness/mass/nodules °Lungs: diminished breath sounds bibasilar °Heart: regular rate and rhythm °Abdomen: soft, non-tender; bowel sounds normal; no masses,  no organomegaly and obese °Extremities: edema trace bilateral pedal °Pulses: 2+ and symmetric °Skin: chronic LE venous stasis changes °Neurologic: Grossly normal °Psych: Pleasant ° °Inpatient Medications  °  °Scheduled Meds: ° aspirin EC  81 mg Oral Daily  ° carvedilol  12.5 mg Oral BID WC  ° furosemide  40 mg Intravenous Daily  ° isosorbide mononitrate  30 mg Oral Daily  ° losartan  100 mg Oral Daily  ° potassium chloride  20 mEq Oral Daily  ° sodium chloride flush  3 mL Intravenous Q12H  °  spironolactone  25 mg Oral Daily  ° Vitamin D (Ergocalciferol)  50,000 Units Oral Weekly  ° ° °Continuous Infusions: ° sodium chloride    ° sodium chloride 10 mL/hr at 07/03/21 0622  ° heparin 2,250 Units/hr (07/03/21 0622)  ° ° °PRN Meds: °sodium chloride, acetaminophen, nitroGLYCERIN, ondansetron (ZOFRAN) IV, sodium chloride flush  ° °Labs  ° °Results for orders placed or performed during the hospital encounter of 07/01/21 (from the past 48 hour(s))  °Brain natriuretic peptide     Status: Abnormal  ° Collection Time: 07/01/21 11:36 AM  °Result Value Ref Range  ° B Natriuretic Peptide 688.0 (H) 0.0 - 100.0 pg/mL  °  Comment: Performed at [facility], [address] [place], [place] [address]  °Comprehensive metabolic panel     Status: Abnormal  ° Collection Time: 07/01/21 11:36 AM  °Result Value Ref Range  ° Sodium 136 135 - 145 mmol/L  ° Potassium 4.3 3.5 - 5.1 mmol/L  °  Comment: SLIGHT HEMOLYSIS  ° Chloride 106 98 - 111 mmol/L  ° CO2 24 22 - 32 mmol/L  ° Glucose, Bld 158 (H) 70 - 99 mg/dL  °  Comment: Glucose reference range applies only to samples taken after fasting for at least 8 hours.  ° BUN 19 6 - 20 mg/dL  ° Creatinine, Ser 0.99 0.61 - 1.24 mg/dL  ° Calcium   8.1 (L) 8.9 - 10.3 mg/dL  ° Total Protein 6.4 (L) 6.5 - 8.1 g/dL  ° Albumin 3.7 3.5 - 5.0 g/dL  ° AST 76 (H) 15 - 41 U/L  ° ALT 91 (H) 0 - 44 U/L  ° Alkaline Phosphatase 77 38 - 126 U/L  ° Total Bilirubin 1.7 (H) 0.3 - 1.2 mg/dL  ° GFR, Estimated >60 >60 mL/min  °  Comment: (NOTE) °Calculated using the CKD-EPI Creatinine Equation (2021) °  ° Anion gap 6 5 - 15  °  Comment: Performed at [facility], [address] [place], [place] [address]  °CBC with Differential     Status: None  ° Collection Time: 07/01/21 11:36 AM  °Result Value Ref Range  ° WBC 8.3 4.0 - 10.5 K/uL  ° RBC 4.81 4.22 - 5.81 MIL/uL  ° Hemoglobin 14.7 13.0 - 17.0 g/dL  ° HCT 43.8 39.0 - 52.0 %  ° MCV 91.1 80.0 - 100.0 fL  ° MCH 30.6 26.0 - 34.0  pg  ° MCHC 33.6 30.0 - 36.0 g/dL  ° RDW 13.1 11.5 - 15.5 %  ° Platelets PLATELET CLUMPS NOTED ON SMEAR, UNABLE TO ESTIMATE 150 - 400 K/uL  ° nRBC 0.0 0.0 - 0.2 %  ° Neutrophils Relative % 75 %  ° Neutro Abs 6.2 1.7 - 7.7 K/uL  ° Lymphocytes Relative 16 %  ° Lymphs Abs 1.3 0.7 - 4.0 K/uL  ° Monocytes Relative 7 %  ° Monocytes Absolute 0.5 0.1 - 1.0 K/uL  ° Eosinophils Relative 1 %  ° Eosinophils Absolute 0.1 0.0 - 0.5 K/uL  ° Basophils Relative 1 %  ° Basophils Absolute 0.1 0.0 - 0.1 K/uL  ° Immature Granulocytes 0 %  ° Abs Immature Granulocytes 0.02 0.00 - 0.07 K/uL  °  Comment: Performed at [facility], [address] [place], [place] [address]  °Troponin I (High Sensitivity)     Status: Abnormal  ° Collection Time: 07/01/21 11:36 AM  °Result Value Ref Range  ° Troponin I (High Sensitivity) 2,486 (HH) <18 ng/L  °  Comment: CRITICAL RESULT CALLED TO, READ BACK BY AND VERIFIED WITH: °BRATU,D. EMTP AT 1445 07/01/21 MULLINS,T °(NOTE) °Elevated high sensitivity troponin I (hsTnI) values and significant  °changes across serial measurements may suggest ACS but many other  °chronic and acute conditions are known to elevate hsTnI results.  °Refer to the Links section for chest pain algorithms and additional  °guidance. °[facility] at  [facility] [address] °, [place] [address] °  °Resp Panel by RT-PCR (Flu A&B, Covid) Nasopharyngeal Swab     Status: None  ° Collection Time: 07/01/21 12:34 PM  ° Specimen: Nasopharyngeal Swab; Nasopharyngeal(NP) swabs in vial transport medium  °Result Value Ref Range  ° SARS Coronavirus 2 by RT PCR NEGATIVE NEGATIVE  °  Comment: (NOTE) °SARS-CoV-2 target nucleic acids are NOT DETECTED. ° °The SARS-CoV-2 RNA is generally detectable in upper respiratory °specimens during the acute phase of infection. The lowest °concentration of SARS-CoV-2 viral copies this assay can detect is °138 copies/mL. A negative result does not preclude  SARS-Cov-2 °infection and should not be used as the sole basis for treatment or °other patient management decisions. A negative result may occur with  °improper specimen collection/handling, submission of specimen other °than nasopharyngeal swab, presence of viral mutation(s) within the °areas targeted by this assay, and inadequate number of viral °copies(<138 copies/mL). A negative result must be combined with °clinical observations, patient   history, and epidemiological °information. The expected result is Negative. ° °Fact Sheet for Patients:  °https://www.fda.gov/media/152166/download ° °Fact Sheet for Healthcare Providers:  °https://www.fda.gov/media/152162/download ° °This test is no t yet approved or cleared by the [place] FDA and  °has been authorized for detection and/or diagnosis of SARS-CoV-2 by °FDA under an Emergency Use Authorization (EUA). This EUA will remain  °in effect (meaning this test can be used) for the duration of the °COVID-19 declaration under Section 564(b)(1) of the Act, 21 °U.S.C.section 360bbb-3(b)(1), unless the authorization is terminated  °or revoked sooner.  ° ° °  ° Influenza A by PCR NEGATIVE NEGATIVE  ° Influenza B by PCR NEGATIVE NEGATIVE  °  Comment: (NOTE) °The Xpert Xpress SARS-CoV-2/FLU/RSV plus assay is intended as an aid °in the diagnosis of influenza from Nasopharyngeal swab specimens and °should not be used as a sole basis for treatment. Nasal washings and °aspirates are unacceptable for Xpert Xpress SARS-CoV-2/FLU/RSV °testing. ° °Fact Sheet for Patients: °https://www.fda.gov/media/152166/download ° °Fact Sheet for Healthcare Providers: °https://www.fda.gov/media/152162/download ° °This test is not yet approved or cleared by the [place] FDA and °has been authorized for detection and/or diagnosis of SARS-CoV-2 by °FDA under an Emergency Use Authorization (EUA). This EUA will remain °in effect (meaning this test can be used) for the duration of the °COVID-19  declaration under Section 564(b)(1) of the Act, 21 U.S.C. °section 360bbb-3(b)(1), unless the authorization is terminated or °revoked. ° °Performed at [facility], [address] °[place], [place] [address] °  °Troponin I (High Sensitivity)     Status: Abnormal  ° Collection Time: 07/01/21  3:16 PM  °Result Value Ref Range  ° Troponin I (High Sensitivity) 1,249 (HH) <18 ng/L  °  Comment: DELTA CHECK NOTED °CRITICAL VALUE NOTED.  VALUE IS CONSISTENT WITH PREVIOUSLY REPORTED AND CALLED VALUE. °(NOTE) °Elevated high sensitivity troponin I (hsTnI) values and significant  °changes across serial measurements may suggest ACS but many other  °chronic and acute conditions are known to elevate hsTnI results.  °Refer to the Links section for chest pain algorithms and additional  °guidance. °Performed at [facility], [address] °[place], [place] [address] °  °Heparin level (unfractionated)     Status: Abnormal  ° Collection Time: 07/01/21  8:15 PM  °Result Value Ref Range  ° Heparin Unfractionated 0.21 (L) 0.30 - 0.70 IU/mL  °  Comment: (NOTE) °The clinical reportable range upper limit is being lowered to >1.10 °to align with the FDA approved guidance for the current laboratory °assay. ° °If heparin results are below expected values, and patient dosage has  °been confirmed, suggest follow up testing of antithrombin III levels. °Performed at [facility], [address] [place], [place] °[address] °  °HIV Antibody (routine testing w rflx)     Status: None  ° Collection Time: 07/01/21  8:15 PM  °Result Value Ref Range  ° HIV Screen 4th Generation wRfx Non Reactive Non Reactive  °  Comment: Performed at [facility], [address] [place], [place] [address]  °Troponin I (High Sensitivity)     Status: Abnormal  ° Collection Time: 07/01/21  8:15 PM  °Result Value Ref Range  ° Troponin I (High Sensitivity) 2,240 (HH) <18 ng/L  °  Comment: CRITICAL RESULT CALLED TO, READ  BACK BY AND VERIFIED WITH: °DALLAS HART RN 07/01/21 2129 M KOROLESKI °(NOTE) °Elevated high sensitivity troponin I (hsTnI) values and significant  °changes across serial measurements may suggest ACS   but many other  °chronic and acute conditions are known to elevate hsTnI results.  °Refer to the Links section for chest pain algorithms and additional  °guidance. °Performed at [facility], [address] [place], [place] °[address] °  °TSH     Status: None  ° Collection Time: 07/01/21  8:15 PM  °Result Value Ref Range  ° TSH 3.589 0.350 - 4.500 uIU/mL  °  Comment: Performed by a 3rd Generation assay with a functional sensitivity of <=0.01 uIU/mL. °Performed at [facility], [address] [place], [place] [address] °  °Hemoglobin A1c     Status: Abnormal  ° Collection Time: 07/01/21  8:15 PM  °Result Value Ref Range  ° Hgb A1c MFr Bld 6.9 (H) 4.8 - 5.6 %  °  Comment: (NOTE) °Pre diabetes:          5.7%-6.4% ° °Diabetes:              >6.4% ° °Glycemic control for   <7.0% °adults with diabetes °  ° Mean Plasma Glucose 151.33 mg/dL  °  Comment: Performed at [facility], [address] [place], [place] [address]  °Troponin I (High Sensitivity)     Status: Abnormal  ° Collection Time: 07/01/21  9:24 PM  °Result Value Ref Range  ° Troponin I (High Sensitivity) 1,971 (HH) <18 ng/L  °  Comment: CRITICAL VALUE NOTED.  VALUE IS CONSISTENT WITH PREVIOUSLY REPORTED AND CALLED VALUE. °(NOTE) °Elevated high sensitivity troponin I (hsTnI) values and significant  °changes across serial measurements may suggest ACS but many other  °chronic and acute conditions are known to elevate hsTnI results.  °Refer to the Links section for chest pain algorithms and additional  °guidance. °Performed at [facility], [address] [place], [place] °[address] °  °CBC     Status: None  ° Collection Time: 07/02/21  3:28 AM  °Result Value Ref Range  ° WBC 7.5 4.0 - 10.5 K/uL  ° RBC 4.66 4.22 - 5.81 MIL/uL  ° Hemoglobin  14.1 13.0 - 17.0 g/dL  ° HCT 42.5 39.0 - 52.0 %  ° MCV 91.2 80.0 - 100.0 fL  ° MCH 30.3 26.0 - 34.0 pg  ° MCHC 33.2 30.0 - 36.0 g/dL  ° RDW 13.2 11.5 - 15.5 %  ° Platelets 228 150 - 400 K/uL  ° nRBC 0.0 0.0 - 0.2 %  °  Comment: Performed at [facility], [address] , [place] [address]  °Basic metabolic panel     Status: Abnormal  ° Collection Time: 07/02/21  3:28 AM  °Result Value Ref Range  ° Sodium 138 135 - 145 mmol/L  ° Potassium 3.7 3.5 - 5.1 mmol/L  ° Chloride 102 98 - 111 mmol/L  ° CO2 25 22 - 32 mmol/L  ° Glucose, Bld 118 (H) 70 - 99 mg/dL  °  Comment: Glucose reference range applies only to samples taken after fasting for at least 8 hours.  ° BUN 16 6 - 20 mg/dL  ° Creatinine, Ser 1.08 0.61 - 1.24 mg/dL  ° Calcium 8.1 (L) 8.9 - 10.3 mg/dL  ° GFR, Estimated >60 >60 mL/min  °  Comment: (NOTE) °Calculated using the CKD-EPI Creatinine Equation (2021) °  ° Anion gap 11 5 - 15  °  Comment: Performed at [facility], [address] , [place] [address]  °Lipid panel     Status: Abnormal  °   Collection Time: 07/02/21  3:28 AM  °Result Value Ref Range  ° Cholesterol 102 0 - 200 mg/dL  ° Triglycerides 85 <150 mg/dL  ° HDL 32 (L) >40 mg/dL  ° Total CHOL/HDL Ratio 3.2 RATIO  ° VLDL 17 0 - 40 mg/dL  ° LDL Cholesterol 53 0 - 99 mg/dL  °  Comment:        °Total Cholesterol/HDL:CHD Risk °Coronary Heart Disease Risk Table °                    Men   Women ° 1/2 Average Risk   3.4   3.3 ° Average Risk       5.0   4.4 ° 2 X Average Risk   9.6   7.1 ° 3 X Average Risk  23.4   11.0 °       °Use the calculated Patient Ratio °above and the CHD Risk Table °to determine the patient's CHD Risk. °       °ATP III CLASSIFICATION (LDL): ° <100     mg/dL   Optimal ° 100-129  mg/dL   Near or Above °                   Optimal ° 130-159  mg/dL   Borderline ° 160-189  mg/dL   High ° >190     mg/dL   Very High °Performed at [facility], [address] [place], [place] [address] °  °Heparin level  (unfractionated)     Status: Abnormal  ° Collection Time: 07/02/21  3:28 AM  °Result Value Ref Range  ° Heparin Unfractionated <0.10 (L) 0.30 - 0.70 IU/mL  °  Comment: (NOTE) °The clinical reportable range upper limit is being lowered to >1.10 °to align with the FDA approved guidance for the current laboratory °assay. ° °If heparin results are below expected values, and patient dosage has  °been confirmed, suggest follow up testing of antithrombin III levels. °Performed at [facility], [address] [place], [place] °[address] °  °Heparin level (unfractionated)     Status: Abnormal  ° Collection Time: 07/02/21 11:40 AM  °Result Value Ref Range  ° Heparin Unfractionated 0.16 (L) 0.30 - 0.70 IU/mL  °  Comment: (NOTE) °The clinical reportable range upper limit is being lowered to >1.10 °to align with the FDA approved guidance for the current laboratory °assay. ° °If heparin results are below expected values, and patient dosage has  °been confirmed, suggest follow up testing of antithrombin III levels. °Performed at [facility], [address] [place], [place] °[address] °  °Heparin level (unfractionated)     Status: None  ° Collection Time: 07/02/21  7:30 PM  °Result Value Ref Range  ° Heparin Unfractionated 0.31 0.30 - 0.70 IU/mL  °  Comment: (NOTE) °The clinical reportable range upper limit is being lowered to >1.10 °to align with the FDA approved guidance for the current laboratory °assay. ° °If heparin results are below expected values, and patient dosage has  °been confirmed, suggest follow up testing of antithrombin III levels. °Performed at [facility], [address] , [place] °[address] °  °CBC     Status: None  ° Collection Time: 07/03/21  3:35 AM  °Result Value Ref Range  ° WBC 8.0 4.0 - 10.5 K/uL  ° RBC 4.83 4.22 - 5.81 MIL/uL  ° Hemoglobin 14.6 13.0 - 17.0 g/dL  ° HCT 44.3 39.0 -   52.0 %  ° MCV 91.7 80.0 - 100.0 fL  ° MCH 30.2 26.0 - 34.0 pg  ° MCHC 33.0 30.0 - 36.0 g/dL  °  RDW 13.3 11.5 - 15.5 %  ° Platelets 235 150 - 400 K/uL  ° nRBC 0.0 0.0 - 0.2 %  °  Comment: Performed at [facility], [address] [place], [place] [address]  °Heparin level (unfractionated)     Status: None  ° Collection Time: 07/03/21  3:35 AM  °Result Value Ref Range  ° Heparin Unfractionated 0.60 0.30 - 0.70 IU/mL  °  Comment: (NOTE) °The clinical reportable range upper limit is being lowered to >1.10 °to align with the FDA approved guidance for the current laboratory °assay. ° °If heparin results are below expected values, and patient dosage has  °been confirmed, suggest follow up testing of antithrombin III levels. °Performed at [facility], [address] [place], [place] °[address] °  °Basic metabolic panel     Status: Abnormal  ° Collection Time: 07/03/21  3:35 AM  °Result Value Ref Range  ° Sodium 136 135 - 145 mmol/L  ° Potassium 4.3 3.5 - 5.1 mmol/L  ° Chloride 102 98 - 111 mmol/L  ° CO2 21 (L) 22 - 32 mmol/L  ° Glucose, Bld 157 (H) 70 - 99 mg/dL  °  Comment: Glucose reference range applies only to samples taken after fasting for at least 8 hours.  ° BUN 16 6 - 20 mg/dL  ° Creatinine, Ser 0.87 0.61 - 1.24 mg/dL  ° Calcium 8.8 (L) 8.9 - 10.3 mg/dL  ° GFR, Estimated >60 >60 mL/min  °  Comment: (NOTE) °Calculated using the CKD-EPI Creatinine Equation (2021) °  ° Anion gap 13 5 - 15  °  Comment: Performed at [facility], [address] [place], [place] [address]  °Magnesium     Status: None  ° Collection Time: 07/03/21  3:35 AM  °Result Value Ref Range  ° Magnesium 2.2 1.7 - 2.4 mg/dL  °  Comment: Performed at  [facility], [address] [place],  [address]  ° ° °ECG  ° °N/A ° °Telemetry  ° °Sinus with LBBB - Personally Reviewed ° °Radiology  °  °DG Chest 2 View ° °Result Date: 07/01/2021 °CLINICAL DATA:  Shortness of breath EXAM: CHEST - 2 VIEW COMPARISON:  Chest radiograph 02/24/2020 FINDINGS: Heart is markedly enlarged, unchanged. The mediastinal contours are  stable. Enlargement of the pulmonary vasculature particularly on the right suggests pulmonary hypertension. There is central vascular congestion without evidence of overt pulmonary edema. There is no focal consolidation. There is no pleural effusion or pneumothorax. There is no acute osseous abnormality. Abdominal surgical clips are noted. IMPRESSION: Cardiomegaly with vascular congestion but no definite overt pulmonary edema. Electronically Signed   By: Peter  Noone M.D.   On: 07/01/2021 11:42  ° °ECHOCARDIOGRAM COMPLETE ° °Result Date: 07/02/2021 °   ECHOCARDIOGRAM REPORT   Patient Name:   John Olsen Date of Exam: 07/02/2021 Medical Rec #:  [id number]    Height:       66.0 in Accession #:    [id number]   Weight:       234.3 lb Date of Birth:  06/15/1976   BSA:          2.139 m² Patient Age:    45 years     BP:           141/99 mmHg Patient Gender:   M            HR:           75 bpm. Exam Location:  Inpatient Procedure: 2D Echo, Cardiac Doppler and Color Doppler Indications:    NSTEMI I21.4  History:        Patient has prior history of Echocardiogram examinations, most                 recent 07/01/2020. Cardiomyopathy and CHF, Arrythmias:LBBB; Risk                 Factors:Hypertension.  Sonographer:    Sarah Pirrotta RDCS Referring Phys: [id number] BHAVINKUMAR BHAGAT IMPRESSIONS  1. Left ventricular ejection fraction, by estimation, is 30 to 35%. The left ventricle has moderately decreased function. The left ventricle demonstrates regional wall motion abnormalities (see scoring diagram/findings for description). The left ventricular internal cavity size was severely dilated. There is mild concentric left ventricular hypertrophy. Left ventricular diastolic parameters are consistent with Grade II diastolic dysfunction (pseudonormalization). Elevated left ventricular end-diastolic pressure. There is akinesis of the left ventricular, apical anterior wall. There is akinesis of the left ventricular, mid anteroseptal wall.  There is akinesis of the left ventricular, entire inferior wall. There is akinesis of the left ventricular, basal-mid inferoseptal wall.  2. Right ventricular systolic function is normal. The right ventricular size is normal. There is normal pulmonary artery systolic pressure. The estimated right ventricular systolic pressure is 24.2 mmHg.  3. Left atrial size was mildly dilated.  4. Therer is a trivial pericardial effusion is anterior to the right ventricle.  5. The mitral valve is abnormal. Mild mitral valve regurgitation. No evidence of mitral stenosis.  6. The aortic valve is normal in structure. Aortic valve regurgitation is not visualized. No aortic stenosis is present.  7. The inferior vena cava is dilated in size with >50% respiratory variability, suggesting right atrial pressure of 8 mmHg.  8. Compared to prior study, LVF has declined and there are new wall motion abnormalities. FINDINGS  Left Ventricle: Left ventricular ejection fraction, by estimation, is 30 to 35%. The left ventricle has moderately decreased function. The left ventricle demonstrates regional wall motion abnormalities. The left ventricular internal cavity size was severely dilated. There is mild concentric left ventricular hypertrophy. Abnormal (paradoxical) septal motion, consistent with left bundle branch block. Left ventricular diastolic parameters are consistent with Grade II diastolic dysfunction (pseudonormalization). Elevated left ventricular end-diastolic pressure. Right Ventricle: The right ventricular size is normal. No increase in right ventricular wall thickness. Right ventricular systolic function is normal. There is normal pulmonary artery systolic pressure. The tricuspid regurgitant velocity is 2.01 m/s, and  with an assumed right atrial pressure of 8 mmHg, the estimated right ventricular systolic pressure is 24.2 mmHg. Left Atrium: Left atrial size was mildly dilated. Right Atrium: Right atrial size was normal in size.  Pericardium: Trivial pericardial effusion is present. The pericardial effusion is anterior to the right ventricle. Mitral Valve: The mitral valve is abnormal. There is mild thickening of the mitral valve leaflet(s). There is mild calcification of the mitral valve leaflet(s). Mild mitral valve regurgitation. No evidence of mitral valve stenosis. Tricuspid Valve: The tricuspid valve is normal in structure. Tricuspid valve regurgitation is trivial. No evidence of tricuspid stenosis. Aortic Valve: The aortic valve is normal in structure. Aortic valve regurgitation is not visualized. No aortic stenosis is present. Pulmonic Valve: The pulmonic valve was normal in structure. Pulmonic valve regurgitation is not visualized. No evidence of pulmonic stenosis. Aorta:   The aortic root is normal in size and structure. Venous: The inferior vena cava is dilated in size with greater than 50% respiratory variability, suggesting right atrial pressure of 8 mmHg. IAS/Shunts: No atrial level shunt detected by color flow Doppler.  LEFT VENTRICLE PLAX 2D LVIDd:         7.40 cm      Diastology LVIDs:         6.00 cm      LV e' medial:    6.89 cm/s LV PW:         1.30 cm      LV E/e' medial:  15.7 LV IVS:        1.20 cm      LV e' lateral:   7.14 cm/s LVOT diam:     2.20 cm      LV E/e' lateral: 15.1 LV SV:         82 LV SV Index:   38 LVOT Area:     3.80 cm²  LV Volumes (MOD) LV vol d, MOD A2C: 356.0 ml LV vol d, MOD A4C: 329.0 ml LV vol s, MOD A2C: 227.0 ml LV vol s, MOD A4C: 228.0 ml LV SV MOD A2C:     129.0 ml LV SV MOD A4C:     329.0 ml LV SV MOD BP:      126.1 ml RIGHT VENTRICLE RV S prime:     12.00 cm/s TAPSE (M-mode): 2.3 cm LEFT ATRIUM             Index        RIGHT ATRIUM           Index LA diam:        4.50 cm 2.10 cm/m²   RA Area:     20.40 cm² LA Vol (A2C):   82.1 ml 38.38 ml/m²  RA Volume:   57.70 ml  26.98 ml/m² LA Vol (A4C):   82.8 ml 38.71 ml/m² LA Biplane Vol: 83.9 ml 39.22 ml/m²  AORTIC VALVE LVOT Vmax:   126.00 cm/s LVOT  Vmean:  76.800 cm/s LVOT VTI:    0.215 m  AORTA Ao Root diam: 3.50 cm Ao Asc diam:  3.60 cm MITRAL VALVE                TRICUSPID VALVE MV Area (PHT): 3.85 cm²     TR Peak grad:   16.2 mmHg MV Decel Time: 197 msec     TR Vmax:        201.00 cm/s MR PISA:        1.01 cm² MR PISA Radius: 0.40 cm     SHUNTS MV E velocity: 108.00 cm/s  Systemic VTI:  0.22 m MV A velocity: 120.00 cm/s  Systemic Diam: 2.20 cm MV E/A ratio:  0.90 Traci Turner MD Electronically signed by Traci Turner MD Signature Date/Time: 07/02/2021/12:36:14 PM    Final    ° °Cardiac Studies  ° °Echo above ° °Assessment  ° °Principal Problem: °  Acute combined systolic and diastolic congestive heart failure (HCC) °Active Problems: °  LBBB (left bundle branch block) °  DCM (dilated cardiomyopathy) (HCC) °  Essential hypertension °  Demand ischemia (HCC) °  NSTEMI (non-ST elevated myocardial infarction) (HCC) ° ° °Plan  ° °Plan for LHC today with Dr. Smith, given high troponins (previously negative in 2019, at which time cath showed normal coronaries with LVEF 20%). He has new regional wall motion abnormalities. No chest pain - only exertional   dyspnea, orthopnea on admission. Appears near euvolemic. Will switch to po lasix tomorrow. Start farxiga 10 mg daily tomorrow for CHF benefit - this appears covered by Medicaid. ° °Time Spent Directly with Patient: ° °I have spent a total of 25 minutes with the patient reviewing hospital notes, telemetry, EKGs, labs and examining the patient as well as establishing an assessment and plan that was discussed personally with the patient.  > 50% of time was spent in direct patient care. ° °Length of Stay: ° LOS: 2 days  ° °Emerlyn Mehlhoff C. Samone Guhl, MD, FACC, FACP  °[place]   [facility]  °Medical Director of the [facility]  °[facility] °Diplomate of the American Board of Clinical Lipidology °Attending Cardiologist  °Direct Dial: [phone]   Fax: [phone]  °Website:   [email or web address] ° °Lasheba Stevens C Elba Schaber °07/03/2021, 8:20 AM ° ° °

## 2021-07-03 NOTE — Progress Notes (Signed)
ANTICOAGULATION CONSULT NOTE -follow up  Pharmacy Consult for heparin Indication: chest pain/ACS  Allergies  Allergen Reactions   Penicillins Hives    As child    Patient Measurements: Height: 5\' 6"  (167.6 cm) Weight: 107.5 kg (237 lb 1.6 oz) IBW/kg (Calculated) : 63.8 Heparin Dosing Weight: 89.4 kg  Vital Signs: Temp: 97.9 F (36.6 C) (01/12 0930) Temp Source: Oral (01/12 0930) BP: 150/102 (01/12 0930) Pulse Rate: 76 (01/12 0930)  Labs: Recent Labs    07/01/21 1136 07/01/21 1516 07/01/21 2015 07/01/21 2015 07/01/21 2124 07/02/21 0328 07/02/21 1140 07/02/21 1930 07/03/21 0335  HGB 14.7  --   --   --   --  14.1  --   --  14.6  HCT 43.8  --   --   --   --  42.5  --   --  44.3  PLT PLATELET CLUMPS NOTED ON SMEAR, UNABLE TO ESTIMATE  --   --   --   --  228  --   --  235  HEPARINUNFRC  --   --  0.21*   < >  --  <0.10* 0.16* 0.31 0.60  CREATININE 0.99  --   --   --   --  1.08  --   --  0.87  TROPONINIHS 2,486* 1,249* 2,240*  --  1,971*  --   --   --   --    < > = values in this interval not displayed.     Estimated Creatinine Clearance: 125.9 mL/min (by C-G formula based on SCr of 0.87 mg/dL).  Assessment: Pharmacy consulted on 07/02/21 to dose heparin for elevated troponin, r/o ACS.   Heparin continues for NSTEMI. Plan for left heart cath today 07/03/21.  Current heparin level is 0.60, therapeutic on IV heparin infusion  rate 2250 units/hr.  CBC is within normal and stable. No bleeding noted.    Plan is for left heart catherization today 07/03/21. He was not on any anticoagulation prior to admission.  Goal of Therapy:  Heparin level 0.3-0.7 units/ml Monitor platelets by anticoagulation protocol: Yes   Plan:  Continue IV heparin drip 2250 units/hr Daily HL and CBC. Follow  post cath if heparin to be resumed.    08/31/21, RPh Clinical Pharmacist [phone] 07/03/2021 10:26 AM Please check AMION for all [facility] Pharmacy phone numbers After 10:00 PM, call Main  Pharmacy [phone]

## 2021-07-03 NOTE — H&P (View-Only) (Signed)
DAILY PROGRESS NOTE   Patient Name: John Olsen Date of Encounter: 07/03/2021 Cardiologist: Armanda Magic, MD  Chief Complaint   No complaints  Patient Profile   46 year old male with chronic systolic heart failure, LVEF 40-45%, left bundle branch block and hypertension, presents with progressive shortness of breath and acute on chronic systolic congestive heart failure.  Subjective   No issues overnight - plan for LHC today. Diuresis is slowing, only 500 cc negative. Creatinine has now normalized at 0.87.   Objective   Vitals:   07/02/21 1940 07/03/21 0224 07/03/21 0400 07/03/21 0700  BP: 129/73  (!) 128/100 (!) 153/105  Pulse: 87  73 75  Resp: (!) 22  18 (!) 21  Temp: 97.8 F (36.6 C)  97.8 F (36.6 C) 97.9 F (36.6 C)  TempSrc: Other (Comment)   Oral  SpO2: 93%  97% 95%  Weight:  107.5 kg    Height:        Intake/Output Summary (Last 24 hours) at 07/03/2021 0820 Last data filed at 07/03/2021 2841 Gross per 24 hour  Intake 1560.29 ml  Output 2000 ml  Net -439.71 ml   Filed Weights   07/01/21 1921 07/02/21 0344 07/03/21 0224  Weight: 107.9 kg 106.3 kg 107.5 kg    Physical Exam   General appearance: alert and no distress Neck: JVD - 1 cm above sternal notch, no carotid bruit, and thyroid not enlarged, symmetric, no tenderness/mass/nodules Lungs: diminished breath sounds bibasilar Heart: regular rate and rhythm Abdomen: soft, non-tender; bowel sounds normal; no masses,  no organomegaly and obese Extremities: edema trace bilateral pedal Pulses: 2+ and symmetric Skin: chronic LE venous stasis changes Neurologic: Grossly normal Psych: Pleasant  Inpatient Medications    Scheduled Meds:  aspirin EC  81 mg Oral Daily   carvedilol  12.5 mg Oral BID WC   furosemide  40 mg Intravenous Daily   isosorbide mononitrate  30 mg Oral Daily   losartan  100 mg Oral Daily   potassium chloride  20 mEq Oral Daily   sodium chloride flush  3 mL Intravenous Q12H    spironolactone  25 mg Oral Daily   Vitamin D (Ergocalciferol)  50,000 Units Oral Weekly    Continuous Infusions:  sodium chloride     sodium chloride 10 mL/hr at 07/03/21 0622   heparin 2,250 Units/hr (07/03/21 0622)    PRN Meds: sodium chloride, acetaminophen, nitroGLYCERIN, ondansetron (ZOFRAN) IV, sodium chloride flush   Labs   Results for orders placed or performed during the hospital encounter of 07/01/21 (from the past 48 hour(s))  Brain natriuretic peptide     Status: Abnormal   Collection Time: 07/01/21 11:36 AM  Result Value Ref Range   B Natriuretic Peptide 688.0 (H) 0.0 - 100.0 pg/mL    Comment: Performed at [facility], [address] [place], [place] [address]  Comprehensive metabolic panel     Status: Abnormal   Collection Time: 07/01/21 11:36 AM  Result Value Ref Range   Sodium 136 135 - 145 mmol/L   Potassium 4.3 3.5 - 5.1 mmol/L    Comment: SLIGHT HEMOLYSIS   Chloride 106 98 - 111 mmol/L   CO2 24 22 - 32 mmol/L   Glucose, Bld 158 (H) 70 - 99 mg/dL    Comment: Glucose reference range applies only to samples taken after fasting for at least 8 hours.   BUN 19 6 - 20 mg/dL   Creatinine, Ser 1.02 0.61 - 1.24 mg/dL   Calcium  8.1 (L) 8.9 - 10.3 mg/dL   Total Protein 6.4 (L) 6.5 - 8.1 g/dL   Albumin 3.7 3.5 - 5.0 g/dL   AST 76 (H) 15 - 41 U/L   ALT 91 (H) 0 - 44 U/L   Alkaline Phosphatase 77 38 - 126 U/L   Total Bilirubin 1.7 (H) 0.3 - 1.2 mg/dL   GFR, Estimated >90 >30 mL/min    Comment: (NOTE) Calculated using the CKD-EPI Creatinine Equation (2021)    Anion gap 6 5 - 15    Comment: Performed at [facility], [address], [place], [place] [address]  CBC with Differential     Status: None   Collection Time: 07/01/21 11:36 AM  Result Value Ref Range   WBC 8.3 4.0 - 10.5 K/uL   RBC 4.81 4.22 - 5.81 MIL/uL   Hemoglobin 14.7 13.0 - 17.0 g/dL   HCT 00.7 62.2 - 63.3 %   MCV 91.1 80.0 - 100.0 fL   MCH 30.6 26.0 - 34.0  pg   MCHC 33.6 30.0 - 36.0 g/dL   RDW 35.4 56.2 - 56.3 %   Platelets PLATELET CLUMPS NOTED ON SMEAR, UNABLE TO ESTIMATE 150 - 400 K/uL   nRBC 0.0 0.0 - 0.2 %   Neutrophils Relative % 75 %   Neutro Abs 6.2 1.7 - 7.7 K/uL   Lymphocytes Relative 16 %   Lymphs Abs 1.3 0.7 - 4.0 K/uL   Monocytes Relative 7 %   Monocytes Absolute 0.5 0.1 - 1.0 K/uL   Eosinophils Relative 1 %   Eosinophils Absolute 0.1 0.0 - 0.5 K/uL   Basophils Relative 1 %   Basophils Absolute 0.1 0.0 - 0.1 K/uL   Immature Granulocytes 0 %   Abs Immature Granulocytes 0.02 0.00 - 0.07 K/uL    Comment: Performed at [facility], [address], [place], [place] [address]  Troponin I (High Sensitivity)     Status: Abnormal   Collection Time: 07/01/21 11:36 AM  Result Value Ref Range   Troponin I (High Sensitivity) 2,486 (HH) <18 ng/L    Comment: CRITICAL RESULT CALLED TO, READ BACK BY AND VERIFIED WITH: BRATU,D. EMTP AT 1445 07/01/21 MULLINS,T (NOTE) Elevated high sensitivity troponin I (hsTnI) values and significant  changes across serial measurements may suggest ACS but many other  chronic and acute conditions are known to elevate hsTnI results.  Refer to the Links section for chest pain algorithms and additional  guidance. Performed at [facility], [address], [place], [place] [address]   Resp Panel by RT-PCR (Flu A&B, Covid) Nasopharyngeal Swab     Status: None   Collection Time: 07/01/21 12:34 PM   Specimen: Nasopharyngeal Swab; Nasopharyngeal(NP) swabs in vial transport medium  Result Value Ref Range   SARS Coronavirus 2 by RT PCR NEGATIVE NEGATIVE    Comment: (NOTE) SARS-CoV-2 target nucleic acids are NOT DETECTED.  The SARS-CoV-2 RNA is generally detectable in upper respiratory specimens during the acute phase of infection. The lowest concentration of SARS-CoV-2 viral copies this assay can detect is 138 copies/mL. A negative result does not preclude  SARS-Cov-2 infection and should not be used as the sole basis for treatment or other patient management decisions. A negative result may occur with  improper specimen collection/handling, submission of specimen other than nasopharyngeal swab, presence of viral mutation(s) within the areas targeted by this assay, and inadequate number of viral copies(<138 copies/mL). A negative result must be combined with clinical observations, patient  history, and epidemiological information. The expected result is Negative.  Fact Sheet for Patients:  BloggerCourse.com  Fact Sheet for Healthcare Providers:  SeriousBroker.it  This test is no t yet approved or cleared by the [place] FDA and  has been authorized for detection and/or diagnosis of SARS-CoV-2 by FDA under an Emergency Use Authorization (EUA). This EUA will remain  in effect (meaning this test can be used) for the duration of the COVID-19 declaration under Section 564(b)(1) of the Act, 21 U.S.C.section 360bbb-3(b)(1), unless the authorization is terminated  or revoked sooner.       Influenza A by PCR NEGATIVE NEGATIVE   Influenza B by PCR NEGATIVE NEGATIVE    Comment: (NOTE) The Xpert Xpress SARS-CoV-2/FLU/RSV plus assay is intended as an aid in the diagnosis of influenza from Nasopharyngeal swab specimens and should not be used as a sole basis for treatment. Nasal washings and aspirates are unacceptable for Xpert Xpress SARS-CoV-2/FLU/RSV testing.  Fact Sheet for Patients: BloggerCourse.com  Fact Sheet for Healthcare Providers: SeriousBroker.it  This test is not yet approved or cleared by the [place] FDA and has been authorized for detection and/or diagnosis of SARS-CoV-2 by FDA under an Emergency Use Authorization (EUA). This EUA will remain in effect (meaning this test can be used) for the duration of the COVID-19  declaration under Section 564(b)(1) of the Act, 21 U.S.C. section 360bbb-3(b)(1), unless the authorization is terminated or revoked.  Performed at [facility], [address] [place], [place] [address]   Troponin I (High Sensitivity)     Status: Abnormal   Collection Time: 07/01/21  3:16 PM  Result Value Ref Range   Troponin I (High Sensitivity) 1,249 (HH) <18 ng/L    Comment: DELTA CHECK NOTED CRITICAL VALUE NOTED.  VALUE IS CONSISTENT WITH PREVIOUSLY REPORTED AND CALLED VALUE. (NOTE) Elevated high sensitivity troponin I (hsTnI) values and significant  changes across serial measurements may suggest ACS but many other  chronic and acute conditions are known to elevate hsTnI results.  Refer to the Links section for chest pain algorithms and additional  guidance. Performed at [facility], [address], [place], [place] [address]   Heparin level (unfractionated)     Status: Abnormal   Collection Time: 07/01/21  8:15 PM  Result Value Ref Range   Heparin Unfractionated 0.21 (L) 0.30 - 0.70 IU/mL    Comment: (NOTE) The clinical reportable range upper limit is being lowered to >1.10 to align with the FDA approved guidance for the current laboratory assay.  If heparin results are below expected values, and patient dosage has  been confirmed, suggest follow up testing of antithrombin III levels. Performed at [facility], [address] [place], [place] [address]   HIV Antibody (routine testing w rflx)     Status: None   Collection Time: 07/01/21  8:15 PM  Result Value Ref Range   HIV Screen 4th Generation wRfx Non Reactive Non Reactive    Comment: Performed at [facility], [address] [place], [place] [address]  Troponin I (High Sensitivity)     Status: Abnormal   Collection Time: 07/01/21  8:15 PM  Result Value Ref Range   Troponin I (High Sensitivity) 2,240 (HH) <18 ng/L    Comment: CRITICAL RESULT CALLED TO, READ  BACK BY AND VERIFIED WITH: DALLAS HART RN 07/01/21 2129 M KOROLESKI (NOTE) Elevated high sensitivity troponin I (hsTnI) values and significant  changes across serial measurements may suggest ACS  but many other  chronic and acute conditions are known to elevate hsTnI results.  Refer to the Links section for chest pain algorithms and additional  guidance. Performed at [facility], [address], [place], [place] [address]   TSH     Status: None   Collection Time: 07/01/21  8:15 PM  Result Value Ref Range   TSH 3.589 0.350 - 4.500 uIU/mL    Comment: Performed by a 3rd Generation assay with a functional sensitivity of <=0.01 uIU/mL. Performed at [facility], [address] [place], [place] [address]   Hemoglobin A1c     Status: Abnormal   Collection Time: 07/01/21  8:15 PM  Result Value Ref Range   Hgb A1c MFr Bld 6.9 (H) 4.8 - 5.6 %    Comment: (NOTE) Pre diabetes:          5.7%-6.4%  Diabetes:              >6.4%  Glycemic control for   <7.0% adults with diabetes    Mean Plasma Glucose 151.33 mg/dL    Comment: Performed at [facility], [address] [place], [place] [address]  Troponin I (High Sensitivity)     Status: Abnormal   Collection Time: 07/01/21  9:24 PM  Result Value Ref Range   Troponin I (High Sensitivity) 1,971 (HH) <18 ng/L    Comment: CRITICAL VALUE NOTED.  VALUE IS CONSISTENT WITH PREVIOUSLY REPORTED AND CALLED VALUE. (NOTE) Elevated high sensitivity troponin I (hsTnI) values and significant  changes across serial measurements may suggest ACS but many other  chronic and acute conditions are known to elevate hsTnI results.  Refer to the Links section for chest pain algorithms and additional  guidance. Performed at [facility], [address], [place], [place] [address]   CBC     Status: None   Collection Time: 07/02/21  3:28 AM  Result Value Ref Range   WBC 7.5 4.0 - 10.5 K/uL   RBC 4.66 4.22 - 5.81 MIL/uL   Hemoglobin  14.1 13.0 - 17.0 g/dL   HCT 29.5 62.1 - 30.8 %   MCV 91.2 80.0 - 100.0 fL   MCH 30.3 26.0 - 34.0 pg   MCHC 33.2 30.0 - 36.0 g/dL   RDW 65.7 84.6 - 96.2 %   Platelets 228 150 - 400 K/uL   nRBC 0.0 0.0 - 0.2 %    Comment: Performed at [facility], [address] [place], [place] [address]  Basic metabolic panel     Status: Abnormal   Collection Time: 07/02/21  3:28 AM  Result Value Ref Range   Sodium 138 135 - 145 mmol/L   Potassium 3.7 3.5 - 5.1 mmol/L   Chloride 102 98 - 111 mmol/L   CO2 25 22 - 32 mmol/L   Glucose, Bld 118 (H) 70 - 99 mg/dL    Comment: Glucose reference range applies only to samples taken after fasting for at least 8 hours.   BUN 16 6 - 20 mg/dL   Creatinine, Ser 1.32 0.61 - 1.24 mg/dL   Calcium 8.1 (L) 8.9 - 10.3 mg/dL   GFR, Estimated >44 >01 mL/min    Comment: (NOTE) Calculated using the CKD-EPI Creatinine Equation (2021)    Anion gap 11 5 - 15    Comment: Performed at [facility], [address], [place], [place] [address]  Lipid panel     Status: Abnormal  Collection Time: 07/02/21  3:28 AM  Result Value Ref Range   Cholesterol 102 0 - 200 mg/dL   Triglycerides 85 <829 mg/dL   HDL 32 (L) >56 mg/dL   Total CHOL/HDL Ratio 3.2 RATIO   VLDL 17 0 - 40 mg/dL   LDL Cholesterol 53 0 - 99 mg/dL    Comment:        Total Cholesterol/HDL:CHD Risk Coronary Heart Disease Risk Table                     Men   Women  1/2 Average Risk   3.4   3.3  Average Risk       5.0   4.4  2 X Average Risk   9.6   7.1  3 X Average Risk  23.4   11.0        Use the calculated Patient Ratio above and the CHD Risk Table to determine the patient's CHD Risk.        ATP III CLASSIFICATION (LDL):  <100     mg/dL   Optimal  213-086  mg/dL   Near or Above                    Optimal  130-159  mg/dL   Borderline  578-469  mg/dL   High  >629     mg/dL   Very High Performed at [facility], [address], [place], [place] [address]   Heparin level  (unfractionated)     Status: Abnormal   Collection Time: 07/02/21  3:28 AM  Result Value Ref Range   Heparin Unfractionated <0.10 (L) 0.30 - 0.70 IU/mL    Comment: (NOTE) The clinical reportable range upper limit is being lowered to >1.10 to align with the FDA approved guidance for the current laboratory assay.  If heparin results are below expected values, and patient dosage has  been confirmed, suggest follow up testing of antithrombin III levels. Performed at [facility], [address], [place], [place] [address]   Heparin level (unfractionated)     Status: Abnormal   Collection Time: 07/02/21 11:40 AM  Result Value Ref Range   Heparin Unfractionated 0.16 (L) 0.30 - 0.70 IU/mL    Comment: (NOTE) The clinical reportable range upper limit is being lowered to >1.10 to align with the FDA approved guidance for the current laboratory assay.  If heparin results are below expected values, and patient dosage has  been confirmed, suggest follow up testing of antithrombin III levels. Performed at [facility], [address], [place], [place] [address]   Heparin level (unfractionated)     Status: None   Collection Time: 07/02/21  7:30 PM  Result Value Ref Range   Heparin Unfractionated 0.31 0.30 - 0.70 IU/mL    Comment: (NOTE) The clinical reportable range upper limit is being lowered to >1.10 to align with the FDA approved guidance for the current laboratory assay.  If heparin results are below expected values, and patient dosage has  been confirmed, suggest follow up testing of antithrombin III levels. Performed at [facility], [address], [place], [place] [address]   CBC     Status: None   Collection Time: 07/03/21  3:35 AM  Result Value Ref Range   WBC 8.0 4.0 - 10.5 K/uL   RBC 4.83 4.22 - 5.81 MIL/uL   Hemoglobin 14.6 13.0 - 17.0 g/dL   HCT 40.3 47.4 -  52.0 %   MCV 91.7 80.0 - 100.0 fL   MCH 30.2 26.0 - 34.0 pg   MCHC 33.0 30.0 - 36.0 g/dL    RDW 78.2 95.6 - 21.3 %   Platelets 235 150 - 400 K/uL   nRBC 0.0 0.0 - 0.2 %    Comment: Performed at [facility], [address], [place], [place] [address]  Heparin level (unfractionated)     Status: None   Collection Time: 07/03/21  3:35 AM  Result Value Ref Range   Heparin Unfractionated 0.60 0.30 - 0.70 IU/mL    Comment: (NOTE) The clinical reportable range upper limit is being lowered to >1.10 to align with the FDA approved guidance for the current laboratory assay.  If heparin results are below expected values, and patient dosage has  been confirmed, suggest follow up testing of antithrombin III levels. Performed at [facility], [address], [place], [place] [address]   Basic metabolic panel     Status: Abnormal   Collection Time: 07/03/21  3:35 AM  Result Value Ref Range   Sodium 136 135 - 145 mmol/L   Potassium 4.3 3.5 - 5.1 mmol/L   Chloride 102 98 - 111 mmol/L   CO2 21 (L) 22 - 32 mmol/L   Glucose, Bld 157 (H) 70 - 99 mg/dL    Comment: Glucose reference range applies only to samples taken after fasting for at least 8 hours.   BUN 16 6 - 20 mg/dL   Creatinine, Ser 2.95 0.61 - 1.24 mg/dL   Calcium 8.8 (L) 8.9 - 10.3 mg/dL   GFR, Estimated >28 >41 mL/min    Comment: (NOTE) Calculated using the CKD-EPI Creatinine Equation (2021)    Anion gap 13 5 - 15    Comment: Performed at [facility], [address] [place], [place] [address]  Magnesium     Status: None   Collection Time: 07/03/21  3:35 AM  Result Value Ref Range   Magnesium 2.2 1.7 - 2.4 mg/dL    Comment: Performed at [facility], [address], [place], [place] [address]    ECG   N/A  Telemetry   Sinus with LBBB - Personally Reviewed  Radiology    DG Chest 2 View  Result Date: 07/01/2021 CLINICAL DATA:  Shortness of breath EXAM: CHEST - 2 VIEW COMPARISON:  Chest radiograph 02/24/2020 FINDINGS: Heart is markedly enlarged, unchanged. The mediastinal contours are  stable. Enlargement of the pulmonary vasculature particularly on the right suggests pulmonary hypertension. There is central vascular congestion without evidence of overt pulmonary edema. There is no focal consolidation. There is no pleural effusion or pneumothorax. There is no acute osseous abnormality. Abdominal surgical clips are noted. IMPRESSION: Cardiomegaly with vascular congestion but no definite overt pulmonary edema. Electronically Signed   By: Lesia Hausen M.D.   On: 07/01/2021 11:42   ECHOCARDIOGRAM COMPLETE  Result Date: 07/02/2021    ECHOCARDIOGRAM REPORT   Patient Name:   John Olsen Date of Exam: 07/02/2021 Medical Rec #:  [id number]    Height:       66.0 in Accession #:    [id number]   Weight:       234.3 lb Date of Birth:  Sep 27, 1975   BSA:          2.139 m Patient Age:    45 years     BP:           141/99 mmHg Patient Gender:  M            HR:           75 bpm. Exam Location:  Inpatient Procedure: 2D Echo, Cardiac Doppler and Color Doppler Indications:    NSTEMI I21.4  History:        Patient has prior history of Echocardiogram examinations, most                 recent 07/01/2020. Cardiomyopathy and CHF, Arrythmias:LBBB; Risk                 Factors:Hypertension.  Sonographer:    Eulah Pont RDCS Referring Phys: [id number] Manson Passey IMPRESSIONS  1. Left ventricular ejection fraction, by estimation, is 30 to 35%. The left ventricle has moderately decreased function. The left ventricle demonstrates regional wall motion abnormalities (see scoring diagram/findings for description). The left ventricular internal cavity size was severely dilated. There is mild concentric left ventricular hypertrophy. Left ventricular diastolic parameters are consistent with Grade II diastolic dysfunction (pseudonormalization). Elevated left ventricular end-diastolic pressure. There is akinesis of the left ventricular, apical anterior wall. There is akinesis of the left ventricular, mid anteroseptal wall.  There is akinesis of the left ventricular, entire inferior wall. There is akinesis of the left ventricular, basal-mid inferoseptal wall.  2. Right ventricular systolic function is normal. The right ventricular size is normal. There is normal pulmonary artery systolic pressure. The estimated right ventricular systolic pressure is 24.2 mmHg.  3. Left atrial size was mildly dilated.  4. Therer is a trivial pericardial effusion is anterior to the right ventricle.  5. The mitral valve is abnormal. Mild mitral valve regurgitation. No evidence of mitral stenosis.  6. The aortic valve is normal in structure. Aortic valve regurgitation is not visualized. No aortic stenosis is present.  7. The inferior vena cava is dilated in size with >50% respiratory variability, suggesting right atrial pressure of 8 mmHg.  8. Compared to prior study, LVF has declined and there are new wall motion abnormalities. FINDINGS  Left Ventricle: Left ventricular ejection fraction, by estimation, is 30 to 35%. The left ventricle has moderately decreased function. The left ventricle demonstrates regional wall motion abnormalities. The left ventricular internal cavity size was severely dilated. There is mild concentric left ventricular hypertrophy. Abnormal (paradoxical) septal motion, consistent with left bundle branch block. Left ventricular diastolic parameters are consistent with Grade II diastolic dysfunction (pseudonormalization). Elevated left ventricular end-diastolic pressure. Right Ventricle: The right ventricular size is normal. No increase in right ventricular wall thickness. Right ventricular systolic function is normal. There is normal pulmonary artery systolic pressure. The tricuspid regurgitant velocity is 2.01 m/s, and  with an assumed right atrial pressure of 8 mmHg, the estimated right ventricular systolic pressure is 24.2 mmHg. Left Atrium: Left atrial size was mildly dilated. Right Atrium: Right atrial size was normal in size.  Pericardium: Trivial pericardial effusion is present. The pericardial effusion is anterior to the right ventricle. Mitral Valve: The mitral valve is abnormal. There is mild thickening of the mitral valve leaflet(s). There is mild calcification of the mitral valve leaflet(s). Mild mitral valve regurgitation. No evidence of mitral valve stenosis. Tricuspid Valve: The tricuspid valve is normal in structure. Tricuspid valve regurgitation is trivial. No evidence of tricuspid stenosis. Aortic Valve: The aortic valve is normal in structure. Aortic valve regurgitation is not visualized. No aortic stenosis is present. Pulmonic Valve: The pulmonic valve was normal in structure. Pulmonic valve regurgitation is not visualized. No evidence of pulmonic stenosis. Aorta:  The aortic root is normal in size and structure. Venous: The inferior vena cava is dilated in size with greater than 50% respiratory variability, suggesting right atrial pressure of 8 mmHg. IAS/Shunts: No atrial level shunt detected by color flow Doppler.  LEFT VENTRICLE PLAX 2D LVIDd:         7.40 cm      Diastology LVIDs:         6.00 cm      LV e' medial:    6.89 cm/s LV PW:         1.30 cm      LV E/e' medial:  15.7 LV IVS:        1.20 cm      LV e' lateral:   7.14 cm/s LVOT diam:     2.20 cm      LV E/e' lateral: 15.1 LV SV:         82 LV SV Index:   38 LVOT Area:     3.80 cm  LV Volumes (MOD) LV vol d, MOD A2C: 356.0 ml LV vol d, MOD A4C: 329.0 ml LV vol s, MOD A2C: 227.0 ml LV vol s, MOD A4C: 228.0 ml LV SV MOD A2C:     129.0 ml LV SV MOD A4C:     329.0 ml LV SV MOD BP:      126.1 ml RIGHT VENTRICLE RV S prime:     12.00 cm/s TAPSE (M-mode): 2.3 cm LEFT ATRIUM             Index        RIGHT ATRIUM           Index LA diam:        4.50 cm 2.10 cm/m   RA Area:     20.40 cm LA Vol (A2C):   82.1 ml 38.38 ml/m  RA Volume:   57.70 ml  26.98 ml/m LA Vol (A4C):   82.8 ml 38.71 ml/m LA Biplane Vol: 83.9 ml 39.22 ml/m  AORTIC VALVE LVOT Vmax:   126.00 cm/s LVOT  Vmean:  76.800 cm/s LVOT VTI:    0.215 m  AORTA Ao Root diam: 3.50 cm Ao Asc diam:  3.60 cm MITRAL VALVE                TRICUSPID VALVE MV Area (PHT): 3.85 cm     TR Peak grad:   16.2 mmHg MV Decel Time: 197 msec     TR Vmax:        201.00 cm/s MR PISA:        1.01 cm MR PISA Radius: 0.40 cm     SHUNTS MV E velocity: 108.00 cm/s  Systemic VTI:  0.22 m MV A velocity: 120.00 cm/s  Systemic Diam: 2.20 cm MV E/A ratio:  0.90 Armanda Magic MD Electronically signed by Armanda Magic MD Signature Date/Time: 07/02/2021/12:36:14 PM    Final     Cardiac Studies   Echo above  Assessment   Principal Problem:   Acute combined systolic and diastolic congestive heart failure (HCC) Active Problems:   LBBB (left bundle branch block)   DCM (dilated cardiomyopathy) (HCC)   Essential hypertension   Demand ischemia (HCC)   NSTEMI (non-ST elevated myocardial infarction) [facility])   Plan   Plan for LHC today with Dr. Katrinka Blazing, given high troponins (previously negative in 2019, at which time cath showed normal coronaries with LVEF 20%). He has new regional wall motion abnormalities. No chest pain - only exertional  dyspnea, orthopnea on admission. Appears near euvolemic. Will switch to po lasix tomorrow. Start farxiga 10 mg daily tomorrow for CHF benefit - this appears covered by Medicaid.  Time Spent Directly with Patient:  I have spent a total of 25 minutes with the patient reviewing hospital notes, telemetry, EKGs, labs and examining the patient as well as establishing an assessment and plan that was discussed personally with the patient.  > 50% of time was spent in direct patient care.  Length of Stay:  LOS: 2 days   Chrystie Nose, MD, [facility], FACP  [place]   [facility]  Medical Director of the Advanced Lipid Disorders &  Cardiovascular Risk Reduction Clinic Diplomate of the American Board of Clinical Lipidology Attending Cardiologist  Direct Dial: [phone]   Fax: [phone]  Website:   [email or web address]  Lisette Abu Aroldo Galli 07/03/2021, 8:20 AM

## 2021-07-03 NOTE — Discharge Summary (Signed)
Discharge Summary    Patient ID: John Olsen MRN: [id number]; DOB: 09/16/1975  Admit date: 07/01/2021 Discharge date: 07/03/2021  PCP:  Mindi Curling, PA-C   [facility] Providers Cardiologist:  Fransico Him, MD   Discharge Diagnoses    Principal Problem:   Acute combined systolic and diastolic congestive heart failure ([place]) Active Problems:   LBBB (left bundle branch block)   DCM (dilated cardiomyopathy) [facility])   Essential hypertension   Demand ischemia (HCC)   NSTEMI (non-ST elevated myocardial infarction) ([place])   Elevated troponin    Diagnostic Studies/Procedures    Left  heart cath 07/03/21: CONCLUSIONS: Widely patent coronary arteries.  No change from 2019. Normal LVEDP. RECOMMENDATIONS: Elevated troponin secondary to demand ischemia in the setting of severe systolic dysfunction and high filling pressures that have now been appropriately treated. _____________   Echo 07/02/21:  1. Left ventricular ejection fraction, by estimation, is 30 to 35%. The  left ventricle has moderately decreased function. The left ventricle  demonstrates regional wall motion abnormalities (see scoring  diagram/findings for description). The left  ventricular internal cavity size was severely dilated. There is mild  concentric left ventricular hypertrophy. Left ventricular diastolic  parameters are consistent with Grade II diastolic dysfunction  (pseudonormalization). Elevated left ventricular  end-diastolic pressure. There is akinesis of the left ventricular, apical  anterior wall. There is akinesis of the left ventricular, mid anteroseptal  wall. There is akinesis of the left ventricular, entire inferior wall.  There is akinesis of the left  ventricular, basal-mid inferoseptal wall.   2. Right ventricular systolic function is normal. The right ventricular  size is normal. There is normal pulmonary artery systolic pressure. The  estimated right ventricular systolic pressure is  Q000111Q mmHg.   3. Left atrial size was mildly dilated.   4. Therer is a trivial pericardial effusion is anterior to the right  ventricle.   5. The mitral valve is abnormal. Mild mitral valve regurgitation. No  evidence of mitral stenosis.   6. The aortic valve is normal in structure. Aortic valve regurgitation is  not visualized. No aortic stenosis is present.   7. The inferior vena cava is dilated in size with >50% respiratory  variability, suggesting right atrial pressure of 8 mmHg.   8. Compared to prior study, LVF has declined and there are new wall  motion abnormalities.   History of Present Illness     John Olsen is a 46 y.o. male with chronic systolic heart failure, LVEF 40-45%, left bundle branch block and hypertension, presents with progressive shortness of breath and acute on chronic systolic congestive heart failure.  John Olsen is a 46 y.o. male who is being seen today for the evaluation of shortness of breath at the request of Dr. Gilford Raid.  This is a pleasant 46 year old male last seen by Dr. Radford Pax for systolic heart failure in December 2021.  He was seen by pharmacy in March 2022 for hypertension management.  He had about a week of worsening shortness of breath and went to urgent care and was noted to have pulmonary edema on chest x-ray.  He has a history of nonischemic cardiomyopathy having had prior cardiac catheterization showing normal coronaries in 2019.  Last echo in January 2022 demonstrated EF at 40 to 45%, improved from 20% in the past) with severe concentric LVH and global hypokinesis.  Cardiac MRI was performed in February 2022 which showed moderate LV enlargement and global hypokinesis with abnormal septal motion and LVEF 34%, mild to moderate  LVH, evidence of Fabry's disease or late gadolinium enhancement. As mentioned above he has a history of left bundle branch block which was wide on EKG.  In the past he has been intolerant of Entresto and has been on losartan,  carvedilol and spironolactone.  Labs this admission showed a BNP of 688 and a markedly elevated troponin of 2486 and then 1248.  He denies any chest pain over the past week. He has minimal elevations in liver enzymes including AST and ALT of 76 and 91 with normal renal function creatinine 0.99.  Respiratory viral panel was negative.  Chest x-ray shows cardiomegaly with vascular congestion.  EKG shows sinus rhythm with left bundle branch block at 86. He reports compliance with medications.  Hospital Course     Consultants: none  Nonischemic cardiomyopathy Acute on chronic systolic and diastolic heart failure Hypertension LBBB Dilated cardiomyopathy Echocardiogram this admission with LVEF 30-35%. Heart cath revealed normal coronaries. He has not been able to tolerate entresto. GDMT has been titrated to include coreg 12.5 mg BID, farxiga, imdur, losartan, and lasix.   If EF remains low, may need to consider ICD placement. Repeat echo in 3 months.  Pt seen and examined by Dr. Debara Pickett and deemed stable for discharge. I have arranged cardiology follow up.    Did the patient have an acute coronary syndrome (MI, NSTEMI, STEMI, etc) this admission?:  No.   The elevated Troponin was due to the acute medical illness (demand ischemia).      The patient will be scheduled for a TOC follow up appointment in 7-14 days.  A message has been sent to the [facility] and Scheduling Pool at the office where the patient should be seen for follow up.  _____________  Discharge Vitals Blood pressure 120/85, pulse 72, temperature 97.8 F (36.6 C), temperature source Oral, resp. rate 19, height 5\' 6"  (1.676 m), weight 107.5 kg, SpO2 94 %.  Filed Weights   07/01/21 1921 07/02/21 0344 07/03/21 0224  Weight: 107.9 kg 106.3 kg 107.5 kg    Labs & Radiologic Studies    CBC Recent Labs    07/01/21 1136 07/02/21 0328 07/03/21 0335  WBC 8.3 7.5 8.0  NEUTROABS 6.2  --   --   HGB 14.7 14.1 14.6  HCT 43.8 42.5 44.3   MCV 91.1 91.2 91.7  PLT PLATELET CLUMPS NOTED ON SMEAR, UNABLE TO ESTIMATE 228 [id number]   Basic Metabolic Panel Recent Labs    07/02/21 0328 07/03/21 0335  NA 138 136  K 3.7 4.3  CL 102 102  CO2 25 21*  GLUCOSE 118* 157*  BUN 16 16  CREATININE 1.08 0.87  CALCIUM 8.1* 8.8*  MG  --  2.2   Liver Function Tests Recent Labs    07/01/21 1136  AST 76*  ALT 91*  ALKPHOS 77  BILITOT 1.7*  PROT 6.4*  ALBUMIN 3.7   No results for input(s): LIPASE, AMYLASE in the last 72 hours. High Sensitivity Troponin:   Recent Labs  Lab 07/01/21 1136 07/01/21 1516 07/01/21 2015 07/01/21 2124  TROPONINIHS 2,486* 1,249* 2,240* 1,971*    BNP Invalid input(s): POCBNP D-Dimer No results for input(s): DDIMER in the last 72 hours. Hemoglobin A1C Recent Labs    07/01/21 2015  HGBA1C 6.9*   Fasting Lipid Panel Recent Labs    07/02/21 0328  CHOL 102  HDL 32*  LDLCALC 53  TRIG 85  CHOLHDL 3.2   Thyroid Function Tests Recent Labs    07/01/21 2015  TSH 3.589   _____________  DG Chest 2 View  Result Date: 07/01/2021 CLINICAL DATA:  Shortness of breath EXAM: CHEST - 2 VIEW COMPARISON:  Chest radiograph 02/24/2020 FINDINGS: Heart is markedly enlarged, unchanged. The mediastinal contours are stable. Enlargement of the pulmonary vasculature particularly on the right suggests pulmonary hypertension. There is central vascular congestion without evidence of overt pulmonary edema. There is no focal consolidation. There is no pleural effusion or pneumothorax. There is no acute osseous abnormality. Abdominal surgical clips are noted. IMPRESSION: Cardiomegaly with vascular congestion but no definite overt pulmonary edema. Electronically Signed   By: Valetta Mole M.D.   On: 07/01/2021 11:42   CARDIAC CATHETERIZATION  Result Date: 07/03/2021 CONCLUSIONS: Widely patent coronary arteries.  No change from 2019. Normal LVEDP. RECOMMENDATIONS: Elevated troponin secondary to demand ischemia in the setting  of severe systolic dysfunction and high filling pressures that have now been appropriately treated.   ECHOCARDIOGRAM COMPLETE  Result Date: 07/02/2021    ECHOCARDIOGRAM REPORT   Patient Name:   John Olsen Date of Exam: 07/02/2021 Medical Rec #:  [id number]    Height:       66.0 in Accession #:    [id number]   Weight:       234.3 lb Date of Birth:  04-14-1976   BSA:          2.139 m Patient Age:    42 years     BP:           141/99 mmHg Patient Gender: M            HR:           75 bpm. Exam Location:  Inpatient Procedure: 2D Echo, Cardiac Doppler and Color Doppler Indications:    NSTEMI I21.4  History:        Patient has prior history of Echocardiogram examinations, most                 recent 07/01/2020. Cardiomyopathy and CHF, Arrythmias:LBBB; Risk                 Factors:Hypertension.  Sonographer:    Bernadene Person RDCS Referring Phys: [id number] [place]  1. Left ventricular ejection fraction, by estimation, is 30 to 35%. The left ventricle has moderately decreased function. The left ventricle demonstrates regional wall motion abnormalities (see scoring diagram/findings for description). The left ventricular internal cavity size was severely dilated. There is mild concentric left ventricular hypertrophy. Left ventricular diastolic parameters are consistent with Grade II diastolic dysfunction (pseudonormalization). Elevated left ventricular end-diastolic pressure. There is akinesis of the left ventricular, apical anterior wall. There is akinesis of the left ventricular, mid anteroseptal wall. There is akinesis of the left ventricular, entire inferior wall. There is akinesis of the left ventricular, basal-mid inferoseptal wall.  2. Right ventricular systolic function is normal. The right ventricular size is normal. There is normal pulmonary artery systolic pressure. The estimated right ventricular systolic pressure is Q000111Q mmHg.  3. Left atrial size was mildly dilated.  4. Therer is a  trivial pericardial effusion is anterior to the right ventricle.  5. The mitral valve is abnormal. Mild mitral valve regurgitation. No evidence of mitral stenosis.  6. The aortic valve is normal in structure. Aortic valve regurgitation is not visualized. No aortic stenosis is present.  7. The inferior vena cava is dilated in size with >50% respiratory variability, suggesting right atrial pressure of 8 mmHg.  8. Compared to prior study, LVF has declined and  there are new wall motion abnormalities. FINDINGS  Left Ventricle: Left ventricular ejection fraction, by estimation, is 30 to 35%. The left ventricle has moderately decreased function. The left ventricle demonstrates regional wall motion abnormalities. The left ventricular internal cavity size was severely dilated. There is mild concentric left ventricular hypertrophy. Abnormal (paradoxical) septal motion, consistent with left bundle branch block. Left ventricular diastolic parameters are consistent with Grade II diastolic dysfunction (pseudonormalization). Elevated left ventricular end-diastolic pressure. Right Ventricle: The right ventricular size is normal. No increase in right ventricular wall thickness. Right ventricular systolic function is normal. There is normal pulmonary artery systolic pressure. The tricuspid regurgitant velocity is 2.01 m/s, and  with an assumed right atrial pressure of 8 mmHg, the estimated right ventricular systolic pressure is Q000111Q mmHg. Left Atrium: Left atrial size was mildly dilated. Right Atrium: Right atrial size was normal in size. Pericardium: Trivial pericardial effusion is present. The pericardial effusion is anterior to the right ventricle. Mitral Valve: The mitral valve is abnormal. There is mild thickening of the mitral valve leaflet(s). There is mild calcification of the mitral valve leaflet(s). Mild mitral valve regurgitation. No evidence of mitral valve stenosis. Tricuspid Valve: The tricuspid valve is normal in  structure. Tricuspid valve regurgitation is trivial. No evidence of tricuspid stenosis. Aortic Valve: The aortic valve is normal in structure. Aortic valve regurgitation is not visualized. No aortic stenosis is present. Pulmonic Valve: The pulmonic valve was normal in structure. Pulmonic valve regurgitation is not visualized. No evidence of pulmonic stenosis. Aorta: The aortic root is normal in size and structure. Venous: The inferior vena cava is dilated in size with greater than 50% respiratory variability, suggesting right atrial pressure of 8 mmHg. IAS/Shunts: No atrial level shunt detected by color flow Doppler.  LEFT VENTRICLE PLAX 2D LVIDd:         7.40 cm      Diastology LVIDs:         6.00 cm      LV e' medial:    6.89 cm/s LV PW:         1.30 cm      LV E/e' medial:  15.7 LV IVS:        1.20 cm      LV e' lateral:   7.14 cm/s LVOT diam:     2.20 cm      LV E/e' lateral: 15.1 LV SV:         82 LV SV Index:   38 LVOT Area:     3.80 cm  LV Volumes (MOD) LV vol d, MOD A2C: 356.0 ml LV vol d, MOD A4C: 329.0 ml LV vol s, MOD A2C: 227.0 ml LV vol s, MOD A4C: 228.0 ml LV SV MOD A2C:     129.0 ml LV SV MOD A4C:     329.0 ml LV SV MOD BP:      126.1 ml RIGHT VENTRICLE RV S prime:     12.00 cm/s TAPSE (M-mode): 2.3 cm LEFT ATRIUM             Index        RIGHT ATRIUM           Index LA diam:        4.50 cm 2.10 cm/m   RA Area:     20.40 cm LA Vol (A2C):   82.1 ml 38.38 ml/m  RA Volume:   57.70 ml  26.98 ml/m LA Vol (A4C):   82.8 ml 38.71 ml/m LA  Biplane Vol: 83.9 ml 39.22 ml/m  AORTIC VALVE LVOT Vmax:   126.00 cm/s LVOT Vmean:  76.800 cm/s LVOT VTI:    0.215 m  AORTA Ao Root diam: 3.50 cm Ao Asc diam:  3.60 cm MITRAL VALVE                TRICUSPID VALVE MV Area (PHT): 3.85 cm     TR Peak grad:   16.2 mmHg MV Decel Time: 197 msec     TR Vmax:        201.00 cm/s MR PISA:        1.01 cm MR PISA Radius: 0.40 cm     SHUNTS MV E velocity: 108.00 cm/s  Systemic VTI:  0.22 m MV A velocity: 120.00 cm/s  Systemic  Diam: 2.20 cm MV E/A ratio:  0.90 Fransico Him MD Electronically signed by Fransico Him MD Signature Date/Time: 07/02/2021/12:36:14 PM    Final    Disposition   Pt is being discharged home today in good condition.  Follow-up Plans & Appointments     Follow-up Information     Liliane Shi, PA-C Follow up on 07/30/2021.   Specialties: Cardiology, Physician Assistant Why: 8:00AM Contact information: [id number] [place] [address] [phone]                Discharge Instructions     Diet - low sodium heart healthy   Complete by: As directed    Discharge instructions   Complete by: As directed    No driving for 2 days. No lifting over 5 lbs for 1 week. No sexual activity for 1 week. You may return to work in 1 week. Keep procedure site clean & dry. If you notice increased pain, swelling, bleeding or pus, call/return!  You may shower, but no soaking baths/hot tubs/pools for 1 week.   Increase activity slowly   Complete by: As directed        Discharge Medications   Allergies as of 07/03/2021       Reactions   Penicillins Hives   As child        Medication List     STOP taking these medications    potassium chloride 10 MEQ tablet Commonly known as: KLOR-CON M       TAKE these medications    aspirin 81 MG EC tablet Take 1 tablet (81 mg total) by mouth daily. Swallow whole. Start taking on: July 04, 2021   carvedilol 12.5 MG tablet Commonly known as: COREG Take 1 tablet (12.5 mg total) by mouth 2 (two) times daily with a meal.   dapagliflozin propanediol 10 MG Tabs tablet Commonly known as: FARXIGA Take 1 tablet (10 mg total) by mouth daily. Start taking on: July 04, 2021   furosemide 40 MG tablet Commonly known as: LASIX Take 1 tablet (40 mg total) by mouth daily.   isosorbide mononitrate 30 MG 24 hr tablet Commonly known as: IMDUR Take 1 tablet (30 mg total) by mouth daily. Start taking on: July 04, 2021    losartan 50 MG tablet Commonly known as: COZAAR Take 2 tablets (100 mg total) by mouth daily.   nitroGLYCERIN 0.4 MG SL tablet Commonly known as: NITROSTAT Place 1 tablet (0.4 mg total) under the tongue every 5 (five) minutes x 3 doses as needed for chest pain.   potassium chloride 10 MEQ tablet Commonly known as: KLOR-CON Take 10 mEq by mouth daily.   spironolactone 25 MG tablet Commonly  known as: ALDACTONE TAKE 1 TABLET(25 MG) BY MOUTH DAILY What changed: See the new instructions.   Turmeric 500 MG Caps Take 1-2 tablets by mouth once a week.   Vitamin D (Ergocalciferol) 1.25 MG (50000 UNIT) Caps capsule Commonly known as: DRISDOL Take 50,000 Units by mouth once a week.           Outstanding Labs/Studies   none  Duration of Discharge Encounter   Greater than 30 minutes including physician time.  Signed, Tami Lin Anjel Pardo, PA 07/03/2021, 4:32 PM

## 2021-07-23 ENCOUNTER — Other Ambulatory Visit (HOSPITAL_COMMUNITY): Payer: Self-pay

## 2021-07-25 ENCOUNTER — Telehealth: Payer: Self-pay | Admitting: Cardiology

## 2021-07-25 DIAGNOSIS — I10 Essential (primary) hypertension: Secondary | ICD-10-CM

## 2021-07-25 MED ORDER — LOSARTAN POTASSIUM 50 MG PO TABS: 100.0000 mg | ORAL_TABLET | Freq: Every day | ORAL | 0 refills | Status: DC

## 2021-07-25 NOTE — Telephone Encounter (Signed)
°*  STAT* If patient is at the pharmacy, call can be transferred to refill team.   1. Which medications need to be refilled? (please list name of each medication and dose if known) losartan (COZAAR) 50 MG tablet  2. Which pharmacy/location (including street and city if local pharmacy) is medication to be sent to? [facility] - [place], [place] - [address]  3. Do they need a 30 day or 90 day supply? 90 day

## 2021-07-25 NOTE — Telephone Encounter (Signed)
Pt's medication was sent to pt's pharmacy as requested. Confirmation received.  °

## 2021-07-28 ENCOUNTER — Telehealth: Payer: Self-pay

## 2021-07-28 NOTE — Telephone Encounter (Signed)
**Note De-Identified John Olsen Obfuscation** Wilder Glade PA started through covermymeds. Key: [id number]

## 2021-07-28 NOTE — Telephone Encounter (Signed)
**Note De-Identified Dixie Jafri Obfuscation** [name] Key: [id number] - PA Case ID: [id number] - Rx #: [id number] Outcome: Approved today FARXIGA TAB 10MG  is approved through 07/28/2022.  Drug Farxiga 10MG  tablets Form [employer] Electronic Prior Authorization Form (2017 NCPDP)  [facility] is aware of this approval.

## 2021-07-30 ENCOUNTER — Ambulatory Visit: Payer: Medicaid Other | Admitting: Physician Assistant

## 2021-08-08 ENCOUNTER — Other Ambulatory Visit: Payer: Self-pay

## 2021-08-08 ENCOUNTER — Telehealth: Payer: Self-pay | Admitting: *Deleted

## 2021-08-08 ENCOUNTER — Encounter: Payer: Self-pay | Admitting: Cardiology

## 2021-08-08 ENCOUNTER — Ambulatory Visit (INDEPENDENT_AMBULATORY_CARE_PROVIDER_SITE_OTHER): Payer: Medicaid Other | Admitting: Cardiology

## 2021-08-08 VITALS — BP 128/70 | HR 69 | Ht 66.0 in | Wt 230.2 lb

## 2021-08-08 DIAGNOSIS — I447 Left bundle-branch block, unspecified: Secondary | ICD-10-CM

## 2021-08-08 DIAGNOSIS — I10 Essential (primary) hypertension: Secondary | ICD-10-CM | POA: Diagnosis not present

## 2021-08-08 DIAGNOSIS — G4719 Other hypersomnia: Secondary | ICD-10-CM

## 2021-08-08 DIAGNOSIS — I5022 Chronic systolic (congestive) heart failure: Secondary | ICD-10-CM

## 2021-08-08 MED ORDER — CARVEDILOL 25 MG PO TABS
25.0000 mg | ORAL_TABLET | Freq: Two times a day (BID) | ORAL | 3 refills | Status: DC
Start: 2021-08-08 — End: 2022-06-19

## 2021-08-08 NOTE — Progress Notes (Signed)
Patient Name:         DOB:       Height:     Weight:  Office Name:         Referring Provider:  Today's Date:  Date:    STOP BANG RISK ASSESSMENT    SCORE IS 3 S (snore) Have you been told that you snore?     YES/NO n  T (tired) Are you often tired, fatigued, or sleepy during the day?   YES/NO n  O (obstruction) Do you stop breathing, choke, or gasp during sleep? YES/NO n   P (pressure) Do you have or are you being treated for high blood pressure? YES/NO y  B (BMI) Is your body index greater than 35 kg/m? YES/NO n  A (age) Are you 44 years old or older? YES/NO   N (neck) Do you have a neck circumference greater than 16 inches?   YES/NO y  G (gender) Are you a male? YES/NO y  TOTAL STOP/BANG YES ANSWERS                                                                        For Office Use Only              Procedure Order Form    YES to 3+ Stop Bang questions OR two clinical symptoms - patient qualifies for WatchPAT (CPT 95800)             Clinical Notes: Will consult Sleep Specialist and refer for management of therapy due to patient increased risk of Sleep Apnea. Ordering a sleep study due to the following two clinical symptoms: Excessive daytime sleepiness G47.10 / Gastroesophageal reflux K21.9 / Nocturia R35.1 / Morning Headaches G44.221 / Difficulty concentrating R41.840 / Memory problems or poor judgment G31.84 / Personality changes or irritability R45.4 / Loud snoring R06.83 / Depression F32.9 / Unrefreshed by sleep G47.8 / Impotence N52.9 / History of high blood pressure R03.0 / Insomnia G47.00    I understand that I am proceeding with a home sleep apnea test as ordered by my treating physician. I understand that untreated sleep apnea is a serious cardiovascular risk factor and it is my responsibility to perform the test and seek management for sleep apnea. I will be contacted with the results and be managed for sleep apnea by a local sleep physician. I will be  receiving equipment and further instructions from [facility]. I shall promptly ship back the equipment via the included mailing label. I understand my insurance will be billed for the test and as the patient I am responsible for any insurance related out-of-pocket costs incurred. I have been provided with written instructions and can call for additional video or telephonic instruction, with 24-hour availability of qualified personnel to answer any questions: Patient Help Desk [phone].  Patient Signature ______________________________________________________   Date______________________ Patient Telemedicine Verbal Consent

## 2021-08-08 NOTE — Patient Instructions (Addendum)
Medication Instructions:  Your physician has recommended you make the following change in your medication:   INCREASE the Carvedilol to 25 mg taking 1 twice a day.  You may take 2 of the 12.5 mg tablets twice a day to use them up  *If you need a refill on your cardiac medications before your next appointment, please call your pharmacy*   Lab Work: TODAY:  BMET  If you have labs (blood work) drawn today and your tests are completely normal, you will receive your results only by: MyChart Message (if you have MyChart) OR A paper copy in the mail If you have any lab test that is abnormal or we need to change your treatment, we will call you to review the results.   Testing/Procedures: Your physician has requested that you have an echocardiogram IN 6 WEEKS. Echocardiography is a painless test that uses sound waves to create images of your heart. It provides your doctor with information about the size and shape of your heart and how well your hearts chambers and valves are working. This procedure takes approximately one hour. There are no restrictions for this procedure.   Your physician has recommended that you have a sleep study. This test records several body functions during sleep, including: brain activity, eye movement, oxygen and carbon dioxide blood levels, heart rate and rhythm, breathing rate and rhythm, the flow of air through your mouth and nose, snoring, body muscle movements, and chest and belly movement.  Follow-Up: At [facility], you and your health needs are our priority.  As part of our continuing mission to provide you with exceptional heart care, we have created designated Provider Care Teams.  These Care Teams include your primary Cardiologist (physician) and Advanced Practice Providers (APPs -  Physician Assistants and Nurse Practitioners) who all work together to provide you with the care you need, when you need it.  We recommend signing up for the patient portal called  "MyChart".  Sign up information is provided on this After Visit Summary.  MyChart is used to connect with patients for Virtual Visits (Telemedicine).  Patients are able to view lab/test results, encounter notes, upcoming appointments, etc.  Non-urgent messages can be sent to your provider as well.   To learn more about what you can do with MyChart, go to [email or web address].    Your next appointment:   2 month(s)  The format for your next appointment:   In Person  Provider:   Armanda Magic, MD     Other Instructions

## 2021-08-08 NOTE — Progress Notes (Signed)
Cardiology Office Note:    Date:  08/08/2021   ID:  John Olsen, DOB 1976-03-15, MRN [id number]  PCP:  Mindi Curling, PA-C  Cardiologist:  Fransico Him, MD    Referring MD: Riki Sheer, NP   Chief Complaint  Patient presents with   Congestive Heart Failure   Hypertension    History of Present Illness:    John Olsen is a 46 y.o. male with a hx of HTN, obesity, LBBB, and chronic systolic HF (diagnosed [id number]).  Admitted 4/22-4/25/2019 and diagnosed with systolic HF. Echo showed EF 20-25%. LHC with normal coronaries.  He was initially followed by AHF service and then discharged from their care after EF improved.  He has a wide LBBB on his EKG. No ETOH use. No family hx of HF.  He was intolerant to [facility] and has been on Losartan, carvedilol and Arlyce Harman.  His last EF assessment showed an EF of 40-45% 02/2018.  He recently was hospitalized in January for progressive shortness of breath and was found to be in acute on chronic systolic CHF.  He was found to have worsening LV function with EF 30 to 35% on echo in January 23 and underwent cardiac cath showing widely patent coronary arteries and normal LVEDP.  He is here today for followup and is doing well.  He denies any chest pain or pressure, SOB, DOE, PND, orthopnea, LE edema, dizziness, palpitations or syncope.  He has lost 7lbs since his discharge. He is compliant with his meds and is tolerating meds with no SE.     Past Medical History:  Diagnosis Date   Chronic combined systolic and diastolic CHF (congestive heart failure) (HCC)    Hypertension    LBBB (left bundle branch block)    NICM (nonischemic cardiomyopathy) (HCC)    Pericardial effusion    a. trivial by echo 09/2017.    Past Surgical History:  Procedure Laterality Date   LEFT HEART CATH AND CORONARY ANGIOGRAPHY N/A 07/03/2021   Procedure: LEFT HEART CATH AND CORONARY ANGIOGRAPHY;  Surgeon: Belva Crome, MD;  Location: [place] [facility];  Service: Cardiovascular;   Laterality: N/A;   NEUROBLASTOMA EXCISION     Had prior to 46 year of age, abdominal   RIGHT/LEFT HEART CATH AND CORONARY ANGIOGRAPHY N/A 10/13/2017   Procedure: RIGHT/LEFT HEART CATH AND CORONARY ANGIOGRAPHY;  Surgeon: Belva Crome, MD;  Location: [place] [facility];  Service: Cardiovascular;  Laterality: N/A;   ULTRASOUND GUIDANCE FOR VASCULAR ACCESS  10/13/2017   Procedure: Ultrasound Guidance For Vascular Access;  Surgeon: Belva Crome, MD;  Location: [place] [facility];  Service: Cardiovascular;;    Current Medications: Current Meds  Medication Sig   aspirin 81 MG EC tablet Take 1 tablet (81 mg total) by mouth daily. Swallow whole.   carvedilol (COREG) 12.5 MG tablet Take 1 tablet (12.5 mg total) by mouth 2 (two) times daily with a meal.   dapagliflozin propanediol (FARXIGA) 10 MG TABS tablet Take 1 tablet (10 mg total) by mouth daily.   furosemide (LASIX) 40 MG tablet Take 1 tablet (40 mg total) by mouth daily.   isosorbide mononitrate (IMDUR) 30 MG 24 hr tablet Take 1 tablet (30 mg total) by mouth daily.   losartan (COZAAR) 50 MG tablet Take 2 tablets (100 mg total) by mouth daily.   nitroGLYCERIN (NITROSTAT) 0.4 MG SL tablet Place 1 tablet (0.4 mg total) under the tongue every 5 (five) minutes x 3 doses as needed for chest  pain.   potassium chloride (KLOR-CON) 10 MEQ tablet Take 10 mEq by mouth daily.   spironolactone (ALDACTONE) 25 MG tablet TAKE 1 TABLET(25 MG) BY MOUTH DAILY (Patient taking differently: Take 25 mg by mouth daily.)   Turmeric 500 MG CAPS Take 1-2 tablets by mouth once a week.   Vitamin D, Ergocalciferol, (DRISDOL) 1.25 MG (50000 UNIT) CAPS capsule Take 50,000 Units by mouth once a week.     Allergies:   Penicillins   Social History   Socioeconomic History   Marital status: Single    Spouse name: Not on file   Number of children: Not on file   Years of education: Not on file   Highest education level: Not on file  Occupational History   Occupation:  Unemployed  Tobacco Use   Smoking status: Never   Smokeless tobacco: Never  Vaping Use   Vaping Use: Never used  Substance and Sexual Activity   Alcohol use: Never   Drug use: Never   Sexual activity: Not on file  Other Topics Concern   Not on file  Social History Narrative   Moved back to [place] to live with his parents   Social Determinants of Health   Financial Resource Strain: Not on file  Food Insecurity: Not on file  Transportation Needs: Not on file  Physical Activity: Not on file  Stress: Not on file  Social Connections: Not on file     Family History: The patient's family history includes Cancer in his mother; Coronary artery disease in his father.  ROS:   Please see the history of present illness.    ROS  All other systems reviewed and negative.   EKGs/Labs/Other Studies Reviewed:    The following studies were reviewed today: 2D echo 2019  EKG:  EKG is not ordered today.    Recent Labs: 07/01/2021: ALT 91; B Natriuretic Peptide 688.0; TSH 3.589 07/03/2021: BUN 16; Creatinine, Ser 0.87; Hemoglobin 14.6; Magnesium 2.2; Platelets 235; Potassium 4.3; Sodium 136   Recent Lipid Panel    Component Value Date/Time   CHOL 102 07/02/2021 0328   TRIG 85 07/02/2021 0328   HDL 32 (L) 07/02/2021 0328   CHOLHDL 3.2 07/02/2021 0328   VLDL 17 07/02/2021 0328   LDLCALC 53 07/02/2021 0328    Physical Exam:    VS:  BP 128/70    Pulse 69    Ht 5\' 6"  (1.676 m)    Wt 230 lb 3.2 oz (104.4 kg)    SpO2 96%    BMI 37.16 kg/m     Wt Readings from Last 3 Encounters:  08/08/21 230 lb 3.2 oz (104.4 kg)  07/03/21 237 lb 1.6 oz (107.5 kg)  08/22/20 234 lb 3.2 oz (106.2 kg)     GEN: Well nourished, well developed in no acute distress HEENT: Normal NECK: No JVD; No carotid bruits LYMPHATICS: No lymphadenopathy CARDIAC:RRR, no murmurs, rubs, gallops RESPIRATORY:  Clear to auscultation without rales, wheezing or rhonchi  ABDOMEN: Soft, non-tender,  non-distended MUSCULOSKELETAL:  No edema; No deformity  SKIN: Warm and dry NEUROLOGIC:  Alert and oriented x 3 PSYCHIATRIC:  Normal affect   ASSESSMENT:    1. Chronic systolic heart failure ([place])   2. Essential hypertension   3. LBBB (left bundle branch block)    PLAN:    In order of problems listed above:  1.  Chronic systolic CHF -Recently admitted with acute on chronic systolic CHF and found to have worsening LV function with EF down from  45 to 50% down to 30 to 35% and cardiac cath showed normal coronary arteries.  LVEDP was normal.   -NYHA class I and he has lost 7lbs since discharge -He does not appear volume overloaded on exam today and weight is stable -Continue prescription drug management with Lasix 40 mg daily, spironolactone 25 mg daily, Imdur 30 mg daily, losartan 100mg  daily with as needed refills -increase Carvedilol to 25mg  BID -He did not tolerate Entresto -A sleep study was ordered but he never followed through on this>>I will order an Itamar sleep study -I am going repeat 2D echo in 6 weeks  2.  HTN -BP is controlled on exam today -Continue prescription drug management with spironolactone 25 mg daily, losartan 100mg  daily and increase carvedilol to 25mg  twice daily with as needed refills   3.  LBBB -normal coronary arteries on cath -not candidate for CRT-D since EF has improved to 40-45 % on echo 2019 -2D echo done 07/02/2021 showed a decline in LV function with EF 30 to 35% with mild LVH and grade 2 diastolic dysfunction and mild MR with new focal wall motion abnormalities -Repeat heart cath January 2023 showed normal coronary arteries with normal LVEDP    Medication Adjustments/Labs and Tests Ordered: Current medicines are reviewed at length with the patient today.  Concerns regarding medicines are outlined above.  No orders of the defined types were placed in this encounter.  No orders of the defined types were placed in this  encounter.   Signed, Fransico Him, MD  08/08/2021 2:20 PM    [place]

## 2021-08-08 NOTE — Telephone Encounter (Signed)
Pt was seen in the office today with Dr. Mayford Knife who ordered an Itamar Sleep study. Pt has reviewed the instructional tutorial with verbal understanding to how use sleep study. Pt aware to not open the device box until we have called him with the PIN# once insurance has give authorization. Pt has thanked me for the help.

## 2021-08-09 LAB — BASIC METABOLIC PANEL
BUN/Creatinine Ratio: 17 (ref 9–20)
BUN: 19 mg/dL (ref 6–24)
CO2: 23 mmol/L (ref 20–29)
Calcium: 9.1 mg/dL (ref 8.7–10.2)
Chloride: 100 mmol/L (ref 96–106)
Creatinine, Ser: 1.12 mg/dL (ref 0.76–1.27)
Glucose: 198 mg/dL — ABNORMAL HIGH (ref 70–99)
Potassium: 4.4 mmol/L (ref 3.5–5.2)
Sodium: 138 mmol/L (ref 134–144)
eGFR: 84 mL/min/{1.73_m2} (ref >59)

## 2021-08-18 NOTE — Telephone Encounter (Signed)
Set up 08/08/21 °

## 2021-08-26 ENCOUNTER — Ambulatory Visit: Payer: Medicaid Other | Admitting: Cardiology

## 2021-08-27 ENCOUNTER — Other Ambulatory Visit: Payer: Self-pay | Admitting: Cardiology

## 2021-09-05 NOTE — Telephone Encounter (Signed)
Left message for the pt to call the office back and ask to s/w Okey Regal who does the home sleep studies. Once pt calls back I will give him PIN#  and ok to proceed with Itamar.  ?

## 2021-09-05 NOTE — Telephone Encounter (Signed)
Pt aware ok to proceed with Itamar sleep study. Pt has been given PIN # [id number].  ?Called and made the patient aware that he  may proceed with the [facility] Sleep Study. PIN # provided to the patient. Patient made aware that he will be contacted after the test has been read with the results and any recommendations. Patient verbalized understanding and thanked me for the call.  ? ?

## 2021-09-05 NOTE — Telephone Encounter (Signed)
Prior Authorization is not required for the requested services ?

## 2021-09-16 ENCOUNTER — Ambulatory Visit: Payer: Medicaid Other | Admitting: Physician Assistant

## 2021-09-19 ENCOUNTER — Ambulatory Visit (HOSPITAL_COMMUNITY): Payer: Medicaid Other | Attending: Cardiology

## 2021-09-19 ENCOUNTER — Encounter (INDEPENDENT_AMBULATORY_CARE_PROVIDER_SITE_OTHER): Payer: Self-pay

## 2021-09-19 DIAGNOSIS — I10 Essential (primary) hypertension: Secondary | ICD-10-CM | POA: Insufficient documentation

## 2021-09-19 DIAGNOSIS — I428 Other cardiomyopathies: Secondary | ICD-10-CM

## 2021-09-19 DIAGNOSIS — I5022 Chronic systolic (congestive) heart failure: Secondary | ICD-10-CM | POA: Insufficient documentation

## 2021-09-19 DIAGNOSIS — G4719 Other hypersomnia: Secondary | ICD-10-CM | POA: Diagnosis present

## 2021-09-19 DIAGNOSIS — I447 Left bundle-branch block, unspecified: Secondary | ICD-10-CM | POA: Insufficient documentation

## 2021-09-19 LAB — ECHOCARDIOGRAM COMPLETE: S' Lateral: 6 cm

## 2021-09-19 MED ORDER — PERFLUTREN LIPID MICROSPHERE
1.0000 mL | INTRAVENOUS | Status: AC | PRN
  Administered 2021-09-19: 1 mL via INTRAVENOUS

## 2021-09-22 ENCOUNTER — Telehealth: Payer: Self-pay | Admitting: Cardiology

## 2021-09-22 ENCOUNTER — Ambulatory Visit: Payer: Medicaid Other | Admitting: Cardiology

## 2021-09-22 DIAGNOSIS — I447 Left bundle-branch block, unspecified: Secondary | ICD-10-CM

## 2021-09-22 DIAGNOSIS — I5022 Chronic systolic (congestive) heart failure: Secondary | ICD-10-CM

## 2021-09-22 NOTE — Telephone Encounter (Signed)
-----   Message from Quintella Reichert, MD sent at 09/21/2021  6:45 PM EDT ----- ?Echo shows decreased EF again back to 25-30%.  Refer to EP to discuss BiV AICD given his LBBB and low EF despite max GDMT ?

## 2021-09-22 NOTE — Telephone Encounter (Signed)
The patient has been notified of the result and verbalized understanding.  All questions (if any) were answered. ?Antonieta Iba, RN 09/22/2021 4:46 PM  ?Referral has been placed ? ?

## 2021-09-22 NOTE — Telephone Encounter (Signed)
Pt returning call to get ECHO results ?

## 2021-09-24 ENCOUNTER — Telehealth: Payer: Self-pay | Admitting: Cardiology

## 2021-09-24 NOTE — Telephone Encounter (Signed)
Patient called about the referral to EP. He said that he did not want to schedule an appointment with EP because he was not comfortable with it. Informed the patient that referral would be closed and referring MD notified. ?

## 2021-09-25 ENCOUNTER — Encounter (INDEPENDENT_AMBULATORY_CARE_PROVIDER_SITE_OTHER): Payer: Medicaid Other | Admitting: Cardiology

## 2021-09-25 DIAGNOSIS — G4733 Obstructive sleep apnea (adult) (pediatric): Secondary | ICD-10-CM

## 2021-09-30 ENCOUNTER — Ambulatory Visit: Payer: Medicaid Other

## 2021-09-30 DIAGNOSIS — I10 Essential (primary) hypertension: Secondary | ICD-10-CM

## 2021-09-30 DIAGNOSIS — I447 Left bundle-branch block, unspecified: Secondary | ICD-10-CM

## 2021-09-30 DIAGNOSIS — G4719 Other hypersomnia: Secondary | ICD-10-CM

## 2021-09-30 DIAGNOSIS — I5022 Chronic systolic (congestive) heart failure: Secondary | ICD-10-CM

## 2021-09-30 NOTE — Procedures (Signed)
? ?  SLEEP STUDY REPORT ?Patient Information ?Study Date: 09/25/21 ?Patient Name: John Olsen ?Patient ID: [id number] ?Birth Date: 05/17/1976 ?Age: 46 ?Gender: Male ?BMI: 36.8 (W=229 lb, H=5' 6'') ?Neck Circ.: 18 '' ?Referring Physician: Armanda Magic, MD ? ?TEST DESCRIPTION: Home sleep apnea testing was completed using the WatchPat, a Type 1 device, utilizing  ?peripheral arterial tonometry (PAT), chest movement, actigraphy, pulse oximetry, pulse rate, body position and snore.  ?AHI was calculated with apnea and hypopnea using valid sleep time as the denominator. RDI includes apneas,  ?hypopneas, and RERAs. The data acquired and the scoring of sleep and all associated events were performed in  ?accordance with the recommended standards and specifications as outlined in the AASM Manual for the Scoring of  ?Sleep and Associated Events 2.2.0 (2015). ? ?FINDINGS: ?1. Moderate Obstructive Sleep Apnea with AHI 23.1/hr.  ?2. No Central Sleep Apnea with pAHIc 2.1/hr. ?3. Oxygen desaturations as low as 81%. ?4. Severe snoring was present. O2 sats were < 88% for 14.1 min. ?5. Total sleep time was 3 hrs and 19 min. ?6. 15.5 % of total sleep time was spent in REM sleep.  ?7. Normal sleep onset latency at 21 min ?8. Normal REM sleep onset latency at 73 min.  ?9. Total awakenings were 5.  ? ?DIAGNOSIS:  ?Moderate Obstructive Sleep Apnea (G47.33) ?Nocturnal Hypoxemia ? ?RECOMMENDATIONS: ?1. Clinical correlation of these findings is necessary. The decision to treat obstructive sleep apnea (OSA) is usually  ?based on the presence of apnea symptoms or the presence of associated medical conditions such as Hypertension,  ?Congestive Heart Failure, Atrial Fibrillation or Obesity. The most common symptoms of OSA are snoring, gasping for  ?breath while sleeping, daytime sleepiness and fatigue.  ? ?2. Initiating apnea therapy is recommended given the presence of symptoms and/or associated conditions.  ?Recommend proceeding with one of the  following: ? ? a. Auto-CPAP therapy with a pressure range of 5-20cm H2O. ? ? b. An oral appliance (OA) that can be obtained from certain dentists with expertise in sleep medicine. These are  ?primarily of use in non-obese patients with mild and moderate disease. ? ? c. An ENT consultation which may be useful to look for specific causes of obstruction and possible treatment  ?options. ? ? d. If patient is intolerant to PAP therapy, consider referral to ENT for evaluation for hypoglossal nerve stimulator.  ? ?3. Close follow-up is necessary to ensure success with CPAP or oral appliance therapy for maximum benefit . ? ?4. A follow-up oximetry study on CPAP is recommended to assess the adequacy of therapy and determine the need  ?for supplemental oxygen or the potential need for Bi-level therapy. An arterial blood gas to determine the adequacy of  ?baseline ventilation and oxygenation should also be considered. ? ?5. Healthy sleep recommendations include: adequate nightly sleep (normal 7-9 hrs/night), avoidance of caffeine after  ?noon and alcohol near bedtime, and maintaining a sleep environment that is cool, dark and quiet. ? ?6. Weight loss for overweight patients is recommended. Even modest amounts of weight loss can significantly  ?improve the severity of sleep apnea. ? ?7. Snoring recommendations include: weight loss where appropriate, side sleeping, and avoidance of alcohol before  ?bed. ? ?8. Operation of motor vehicle should not be performed when sleepy. ? ?Signature: ?Electronically Signed: 09/30/21 ?Armanda Magic, MD; [facility]; Diplomat, American Board of  Sleep Medicine ? ?

## 2021-10-07 ENCOUNTER — Telehealth: Payer: Self-pay | Admitting: *Deleted

## 2021-10-07 DIAGNOSIS — G4719 Other hypersomnia: Secondary | ICD-10-CM

## 2021-10-07 DIAGNOSIS — G4733 Obstructive sleep apnea (adult) (pediatric): Secondary | ICD-10-CM

## 2021-10-07 NOTE — Telephone Encounter (Signed)
-----   Message from Gaynelle Cage, CMA sent at 10/01/2021  9:29 AM EDT ----- ? ?----- Message ----- ?From: Quintella Reichert, MD ?Sent: 09/30/2021   4:08 PM EDT ?To: [facility] Sleep Studies ? ?Please let patient know that they have sleep apnea.  Recommend therapeutic CPAP titration for treatment of patient's sleep disordered breathing.  If unable to perform an in lab titration then initiate ResMed auto CPAP from 4 to 15cm H2O with heated humidity and mask of choice and overnight pulse ox on CPAP.    ? ?

## 2021-10-07 NOTE — Telephone Encounter (Signed)
The patient has been notified of the result and verbalized understanding.  All questions (if any) were answered. ?[name], CMA 10/07/2021 5:41 PM   ? ?Titration to precert ? ? ?

## 2021-10-15 NOTE — Telephone Encounter (Signed)
Prior Authorization for titration sent to [facility] via web portal. Tracking Number [id number] ?APPROVED-10/20/21--01/18/22.  ?

## 2021-10-17 ENCOUNTER — Encounter: Payer: Self-pay | Admitting: Cardiology

## 2021-10-17 ENCOUNTER — Ambulatory Visit (INDEPENDENT_AMBULATORY_CARE_PROVIDER_SITE_OTHER): Payer: Medicaid Other | Admitting: Cardiology

## 2021-10-17 VITALS — BP 122/78 | HR 74 | Ht 66.0 in | Wt 220.0 lb

## 2021-10-17 DIAGNOSIS — I10 Essential (primary) hypertension: Secondary | ICD-10-CM

## 2021-10-17 DIAGNOSIS — I447 Left bundle-branch block, unspecified: Secondary | ICD-10-CM

## 2021-10-17 DIAGNOSIS — I5022 Chronic systolic (congestive) heart failure: Secondary | ICD-10-CM | POA: Diagnosis not present

## 2021-10-17 DIAGNOSIS — G4733 Obstructive sleep apnea (adult) (pediatric): Secondary | ICD-10-CM

## 2021-10-17 NOTE — Patient Instructions (Signed)
Medication Instructions:  ?Your physician recommends that you continue on your current medications as directed. Please refer to the Current Medication list given to you today. ? ?*If you need a refill on your cardiac medications before your next appointment, please call your pharmacy* ? ?Follow-Up: ?At [facility], you and your health needs are our priority.  As part of our continuing mission to provide you with exceptional heart care, we have created designated Provider Care Teams.  These Care Teams include your primary Cardiologist (physician) and Advanced Practice Providers (APPs -  Physician Assistants and Nurse Practitioners) who all work together to provide you with the care you need, when you need it. ? ?You have been referred to see an electrophysiologist, Dr. Ladona Ridgel.  ? ?You have been referred to the Advanced Heart Failure [facility].  ? ? ? ? ? ? ? ?  ?

## 2021-10-17 NOTE — Addendum Note (Signed)
Addended by: Antonieta Iba on: 10/17/2021 03:41 PM ? ? Modules accepted: Orders ? ?

## 2021-10-17 NOTE — Progress Notes (Signed)
?Cardiology Office Note:   ? ?Date:  10/17/2021  ? ?ID:  John Olsen, DOB 1975/08/09, MRN [id number] ? ?PCP:  Jordan Hawks, PA-C  ?Cardiologist:  Armanda Magic, MD   ? ?Referring MD: Jordan Hawks, PA-C  ? ?Chief Complaint  ?Patient presents with  ? Congestive Heart Failure  ? Cardiomyopathy  ? Hypertension  ? Sleep Apnea  ? ? ?History of Present Illness:   ? ?John Olsen is a 46 y.o. male with a hx of HTN, obesity, LBBB, and chronic systolic HF (diagnosed 09/2017).  Admitted 4/22-4/25/2019 and diagnosed with systolic HF. Echo showed EF 20-25%. LHC with normal coronaries.  He was initially followed by AHF service and then discharged from their care after EF improved.  He has a wide LBBB on his EKG. No ETOH use. No family hx of HF.  He was intolerant to [facility] and has been on Losartan, carvedilol and Cleda Daub.  His last EF assessment showed an EF of 40-45% 02/2018.  He  was hospitalized January 2023 for progressive shortness of breath and was found to be in acute on chronic systolic CHF.  He was found to have worsening LV function with EF 30 to 35% on echo in January 23 and underwent cardiac cath showing widely patent coronary arteries and normal LVEDP.  He was intolerant to [facility].  At last office visit carvedilol was increased to 25 mg twice daily.  A sleep study was also ordered which showed moderate obstructive sleep apnea with an AHI of 23.1/h with no significant central events.  He had nocturnal hypoxemia with O2 saturations less than 88% for 14 minutes.  He has a CPAP titration pending. ? ?he is here today for followup and is doing well.  He denies any chest pain or pressure, SOB, DOE, PND, orthopnea, LE edema, dizziness, palpitations or syncope. he is compliant with his meds and is tolerating meds with no SE.    ?Past Medical History:  ?Diagnosis Date  ? Chronic combined systolic and diastolic CHF (congestive heart failure) (HCC)   ? Hypertension   ? LBBB (left bundle branch block)   ? NICM (nonischemic  cardiomyopathy) (HCC)   ? Pericardial effusion   ? a. trivial by echo 09/2017.  ? ? ?Past Surgical History:  ?Procedure Laterality Date  ? LEFT HEART CATH AND CORONARY ANGIOGRAPHY N/A 07/03/2021  ? Procedure: LEFT HEART CATH AND CORONARY ANGIOGRAPHY;  Surgeon: Lyn Records, MD;  Location: [facility] INVASIVE [facility];  Service: Cardiovascular;  Laterality: N/A;  ? NEUROBLASTOMA EXCISION    ? Had prior to 46 year of age, abdominal  ? RIGHT/LEFT HEART CATH AND CORONARY ANGIOGRAPHY N/A 10/13/2017  ? Procedure: RIGHT/LEFT HEART CATH AND CORONARY ANGIOGRAPHY;  Surgeon: Lyn Records, MD;  Location: [facility] INVASIVE CV LAB;  Service: Cardiovascular;  Laterality: N/A;  ? ULTRASOUND GUIDANCE FOR VASCULAR ACCESS  10/13/2017  ? Procedure: Ultrasound Guidance For Vascular Access;  Surgeon: Lyn Records, MD;  Location: [facility] INVASIVE CV LAB;  Service: Cardiovascular;;  ? ? ?Current Medications: ?No outpatient medications have been marked as taking for the 10/17/21 encounter (Office Visit) with Quintella Reichert, MD.  ?  ? ?Allergies:   Penicillins  ? ?Social History  ? ?Socioeconomic History  ? Marital status: Single  ?  Spouse name: Not on file  ? Number of children: Not on file  ? Years of education: Not on file  ? Highest education level: Not on file  ?Occupational History  ? Occupation: Unemployed  ?Tobacco  Use  ? Smoking status: Never  ? Smokeless tobacco: Never  ?Vaping Use  ? Vaping Use: Never used  ?Substance and Sexual Activity  ? Alcohol use: Never  ? Drug use: Never  ? Sexual activity: Not on file  ?Other Topics Concern  ? Not on file  ?Social History Narrative  ? Moved back to [place] to live with his parents  ? ?Social Determinants of Health  ? ?Financial Resource Strain: Not on file  ?Food Insecurity: Not on file  ?Transportation Needs: Not on file  ?Physical Activity: Not on file  ?Stress: Not on file  ?Social Connections: Not on file  ?  ? ?Family History: ?The patient's family history includes Cancer in his mother; Coronary  artery disease in his father. ? ?ROS:   ?Please see the history of present illness.    ?ROS  ?All other systems reviewed and negative.  ? ?EKGs/Labs/Other Studies Reviewed:   ? ?The following studies were reviewed today: ?Sleep study ? ?EKG:  EKG is not ordered today.   ? ?Recent Labs: ?07/01/2021: ALT 91; B Natriuretic Peptide 688.0; TSH 3.589 ?07/03/2021: Hemoglobin 14.6; Magnesium 2.2; Platelets 235 ?08/08/2021: BUN 19; Creatinine, Ser 1.12; Potassium 4.4; Sodium 138  ? ?Recent Lipid Panel ?   ?Component Value Date/Time  ? CHOL 102 07/02/2021 0328  ? TRIG 85 07/02/2021 0328  ? HDL 32 (L) 07/02/2021 0328  ? CHOLHDL 3.2 07/02/2021 0328  ? VLDL 17 07/02/2021 0328  ? LDLCALC 53 07/02/2021 0328  ? ? ?Physical Exam:   ? ?VS:  There were no vitals taken for this visit.   ? ?Wt Readings from Last 3 Encounters:  ?08/08/21 230 lb 3.2 oz (104.4 kg)  ?07/03/21 237 lb 1.6 oz (107.5 kg)  ?08/22/20 234 lb 3.2 oz (106.2 kg)  ?  ? ?GEN: Well nourished, well developed in no acute distress ?HEENT: Normal ?NECK: No JVD; No carotid bruits ?LYMPHATICS: No lymphadenopathy ?CARDIAC:RRR, no murmurs, rubs, gallops ?RESPIRATORY:  Clear to auscultation without rales, wheezing or rhonchi  ?ABDOMEN: Soft, non-tender, non-distended ?MUSCULOSKELETAL:  No edema; No deformity  ?SKIN: Warm and dry ?NEUROLOGIC:  Alert and oriented x 3 ?PSYCHIATRIC:  Normal affect   ?ASSESSMENT:   ? ?1. Chronic systolic heart failure (HCC)   ?2. Essential hypertension   ?3. LBBB (left bundle branch block)   ?4. OSA (obstructive sleep apnea)   ? ?PLAN:   ? ?In order of problems listed above: ? ?1.  Chronic systolic CHF ?-Recently admitted with acute on chronic systolic CHF in January 2023 and found to have worsening LV function with EF down from 45 to 50% down to 30 to 35% and cardiac cath showed normal coronary arteries.  LVEDP was normal.   ?-He is currently NYHA class I ?-He appears euvolemic on exam today ?-Continue prescription drug management with Lasix 40 mg  daily, spironolactone 25 mg daily, Imdur 30 mg daily, losartan 100 mg daily and carvedilol 25 mg twice daily and Farxiga 10mg  dailywith as needed refills ?-He did not tolerate Entresto ?-He was recently diagnosed with moderate obstructive sleep apnea and is awaiting CPAP titration ?-Repeat 2D echo 08/2021 showed worsening LV dysfunction with EF 25 to 30% down from 30 to 35% prior.  This is despite maximum GDMT ?-Refer back to advanced heart failure given worsening LV dysfunction ?-Referred to EP as he is at risk for SCD ? ?2.  HTN ?-BP is adequately controlled on exam today ?-Continue prescription drug management spironolactone 25 mg daily, losartan 100 mg  daily, carvedilol 25 mg twice daily with as needed refills ? ?3.  LBBB ?-normal coronary arteries on cath ?-Initially not a candidate for CRT-D since EF had improved to 40-45 % on echo 2019 ?-2D echo done 07/02/2021 showed a decline in LV function with EF 30 to 35% with mild LVH and grade 2 diastolic dysfunction and mild MR with new focal wall motion abnormalities ?-Repeat heart cath January 2023 showed normal coronary arteries with normal LVEDP ?-Now EF appears to have decreased further despite maximum tolerated GDMT ?-Refer to EP for consideration of CRT-D ? ? ? ?Medication Adjustments/Labs and Tests Ordered: ?Current medicines are reviewed at length with the patient today.  Concerns regarding medicines are outlined above.  ?No orders of the defined types were placed in this encounter. ? ?No orders of the defined types were placed in this encounter. ? ? ?Signed, ?Armanda Magic, MD  ?10/17/2021 3:23 PM    ?[facility] ?

## 2021-11-13 ENCOUNTER — Telehealth: Payer: Self-pay | Admitting: Cardiology

## 2021-11-13 NOTE — Telephone Encounter (Signed)
Spoke with the patient who is requesting that he have a repeat echocardiogram done. He states that he is feeling good. He has cancelled his appointment with EP because he would only like an ICD as a last resort. He wants to repeat an echo to see if his EF has improved any.

## 2021-11-13 NOTE — Telephone Encounter (Signed)
Left message for patient with recommendations from Dr. Mayford Knife. Advised to call back with any questions. Will send message to AHF clinic to schedule patient and appointment ASAP per Dr. Mayford Knife.

## 2021-11-13 NOTE — Telephone Encounter (Signed)
Pt would like a call back to get advice on echo. Would like to know if he should or could schedule an echocardiogram. Please advise.

## 2021-11-19 ENCOUNTER — Ambulatory Visit (HOSPITAL_BASED_OUTPATIENT_CLINIC_OR_DEPARTMENT_OTHER): Payer: Medicaid Other | Attending: Cardiology | Admitting: Cardiology

## 2021-11-19 VITALS — Ht 66.0 in | Wt 220.0 lb

## 2021-11-19 DIAGNOSIS — G4733 Obstructive sleep apnea (adult) (pediatric): Secondary | ICD-10-CM | POA: Insufficient documentation

## 2021-11-19 DIAGNOSIS — G4719 Other hypersomnia: Secondary | ICD-10-CM | POA: Insufficient documentation

## 2021-11-20 ENCOUNTER — Encounter (HOSPITAL_BASED_OUTPATIENT_CLINIC_OR_DEPARTMENT_OTHER): Payer: Medicaid Other | Admitting: Cardiology

## 2021-11-21 NOTE — Procedures (Signed)
   Study Date: 11/19/2021 Gender: Male D.O.B: Aug 24, 1975 Age (years): 59 Referring Provider: Fransico Him MD, ABSM Height (inches): 66 Interpreting Physician: Fransico Him MD, ABSM Weight (lbs): 220 RPSGT: Zadie Rhine BMI: 36 MRN: [id number] Neck Size: 17.00  CLINICAL INFORMATION The patient is referred for a CPAP titration to treat sleep apnea.  SLEEP STUDY TECHNIQUE As per the AASM Manual for the Scoring of Sleep and Associated Events v2.3 (April 2016) with a hypopnea requiring 4% desaturations.  The channels recorded and monitored were frontal, central and occipital EEG, electrooculogram (EOG), submentalis EMG (chin), nasal and oral airflow, thoracic and abdominal wall motion, anterior tibialis EMG, snore microphone, electrocardiogram, and pulse oximetry. Continuous positive airway pressure (CPAP) was initiated at the beginning of the study and titrated to treat sleep-disordered breathing.  MEDICATIONS Medications self-administered by patient taken the night of the study : FISH OIL, [employer] Enzymes  TECHNICIAN COMMENTS Comments added by technician: no restroom visted. Patient was restless all through the night. Comments added by scorer: N/A  RESPIRATORY PARAMETERS Optimal PAP Pressure (cm): N/A  AHI at Optimal Pressure (/hr):N/A Overall Minimal O2 (%):91.0  Supine % at Optimal Pressure (%):N/A Minimal O2 at Optimal Pressure (%): N/A   SLEEP ARCHITECTURE The study was initiated at 11:04:53 PM and ended at 4:59:44 AM.  Sleep onset time was 8.2 minutes and the sleep efficiency was 31.4%. The total sleep time was 111.5 minutes.  The patient spent 3.6% of the night in stage N1 sleep, 70.9% in stage N2 sleep, 25.6% in stage N3 and 0% in REM.Stage REM latency was N/A minutes  Wake after sleep onset was 235.1. Alpha intrusion was absent. Supine sleep was 100.00%.  CARDIAC DATA The 2 lead EKG demonstrated sinus rhythm. The mean heart rate was 86.8 beats per minute. Other  EKG findings include: None.  LEG MOVEMENT DATA The total Periodic Limb Movements of Sleep (PLMS) were 0. The PLMS index was 0.0. A PLMS index of <15 is considered normal in adults.  IMPRESSIONS - The optimal PAP pressure could not be obtained due to ongoing respiratory events - Significant oxygen desaturations were not observed during this titration (min O2 = 91.0%). - No snoring was audible during this study. - No cardiac abnormalities were observed during this study. - Clinically significant periodic limb movements were not noted during this study. Arousals associated with PLMs were rare.  DIAGNOSIS - Obstructive Sleep Apnea (G47.33)  RECOMMENDATIONS - Trial of auto CPAP therapy from 4 to 15cm H2O with a Small size Resmed Full Face AirFit F20 mask and heated humidification. - Avoid alcohol, sedatives and other CNS depressants that may worsen sleep apnea and disrupt normal sleep architecture. - Sleep hygiene should be reviewed to assess factors that may improve sleep quality. - Weight management and regular exercise should be initiated or continued. - Return to Sleep Center for re-evaluation after 6 weeks of therapy  [Electronically signed] 11/21/2021 10:27 AM  Fransico Him MD, ABSM Diplomate, American Board of Sleep Medicine

## 2021-11-26 ENCOUNTER — Telehealth: Payer: Self-pay | Admitting: *Deleted

## 2021-11-26 NOTE — Telephone Encounter (Signed)
The patient has been notified of the result and verbalized understanding.  All questions (if any) were answered. [name], CMA 11/26/2021 4:14 PM    Patient has declined to get the cpap and wants to speak to the doctor about other options.

## 2021-11-26 NOTE — Progress Notes (Signed)
The patient has been notified of the the result. Left detailed message on voicemail and informed patient to call back to discuss the next steps of his care.                                          [name], CMA 11/26/2021 2:50 PM

## 2021-11-26 NOTE — Telephone Encounter (Signed)
-----   Message from Gaynelle Cage, [place] sent at 11/21/2021  2:06 PM EDT -----  ----- Message ----- From: Quintella Reichert, MD Sent: 11/21/2021   1:33 PM EDT To: [facility] Sleep Studies  Please let patient know that they had a successful PAP titration and let DME know that orders are in EPIC.  Please set up 6 week OV with me.

## 2021-11-28 NOTE — Telephone Encounter (Signed)
Patient has been notified by voicemail on his phone that Per Dr Mayford Knife there are no other options for sleep therapy at this time for him other than the cpap for him as he does not qualify for the inspire device. Message was left to call back with questions.

## 2021-12-08 ENCOUNTER — Institutional Professional Consult (permissible substitution): Payer: Medicaid Other | Admitting: Internal Medicine

## 2022-01-01 ENCOUNTER — Ambulatory Visit (HOSPITAL_COMMUNITY)
Admission: RE | Admit: 2022-01-01 | Discharge: 2022-01-01 | Disposition: A | Discharge status: Home / Self Care | Payer: Medicaid Other | Source: Ambulatory Visit | Attending: Internal Medicine | Admitting: Internal Medicine

## 2022-01-01 ENCOUNTER — Encounter (HOSPITAL_COMMUNITY): Payer: Self-pay | Admitting: Internal Medicine

## 2022-01-01 VITALS — BP 124/78 | HR 86 | Wt 223.8 lb

## 2022-01-01 DIAGNOSIS — I5042 Chronic combined systolic (congestive) and diastolic (congestive) heart failure: Secondary | ICD-10-CM

## 2022-01-01 DIAGNOSIS — I5022 Chronic systolic (congestive) heart failure: Secondary | ICD-10-CM

## 2022-01-01 DIAGNOSIS — Z79899 Other long term (current) drug therapy: Secondary | ICD-10-CM | POA: Insufficient documentation

## 2022-01-01 DIAGNOSIS — E669 Obesity, unspecified: Secondary | ICD-10-CM | POA: Diagnosis not present

## 2022-01-01 DIAGNOSIS — I447 Left bundle-branch block, unspecified: Secondary | ICD-10-CM

## 2022-01-01 DIAGNOSIS — I428 Other cardiomyopathies: Secondary | ICD-10-CM | POA: Insufficient documentation

## 2022-01-01 DIAGNOSIS — I110 Hypertensive heart disease with heart failure: Secondary | ICD-10-CM | POA: Diagnosis not present

## 2022-01-01 DIAGNOSIS — I10 Essential (primary) hypertension: Secondary | ICD-10-CM | POA: Diagnosis not present

## 2022-01-01 LAB — BASIC METABOLIC PANEL
Anion gap: 12 (ref 5–15)
BUN: 17 mg/dL (ref 6–20)
CO2: 24 mmol/L (ref 22–32)
Calcium: 9.2 mg/dL (ref 8.9–10.3)
Chloride: 100 mmol/L (ref 98–111)
Creatinine, Ser: 1.12 mg/dL (ref 0.61–1.24)
GFR, Estimated: >60 mL/min (ref >60)
Glucose, Bld: 184 mg/dL — ABNORMAL HIGH (ref 70–99)
Potassium: 3.8 mmol/L (ref 3.5–5.1)
Sodium: 136 mmol/L (ref 135–145)

## 2022-01-01 LAB — CBC
HCT: 48.6 % (ref 39.0–52.0)
Hemoglobin: 16.6 g/dL (ref 13.0–17.0)
MCH: 31.4 pg (ref 26.0–34.0)
MCHC: 34.2 g/dL (ref 30.0–36.0)
MCV: 92 fL (ref 80.0–100.0)
Platelets: 243 10*3/uL (ref 150–400)
RBC: 5.28 MIL/uL (ref 4.22–5.81)
RDW: 12 % (ref 11.5–15.5)
WBC: 9.8 10*3/uL (ref 4.0–10.5)
nRBC: 0 % (ref 0.0–0.2)

## 2022-01-01 LAB — BRAIN NATRIURETIC PEPTIDE: B Natriuretic Peptide: 199.4 pg/mL — ABNORMAL HIGH (ref 0.0–100.0)

## 2022-01-01 NOTE — Progress Notes (Signed)
[facility] Consult  Note   Referring Physician: Melina Copa, PA PCP: Mindi Curling, PA-C PCP-Cardiologist: Fransico Him, MD  HF MD; Dr Haroldine Laws  HPI: John Olsen is a 46 y.o. male with a history of HTN, obesity, LBBB, and new systolic HF (diagnosed [id number]).Referred back by Dr. Radford Pax for further evaluation of his HF. I have not seen him since 9/19   Admitted 4/19 and diagnosed with systolic HF. Echo showed EF 20-25%. LHC with normal coronaries. Diuresed with IV lasix and transitioned to 40 mg lasix daily. Started on HF medications per cardiology. DC weight: 206 lbs.   Echo 1/22 EF 40-45% Echo 1/23 EF 30-35% Echo 3/23 EF 25-30%   cMRI 2/22 EF 34% LBBB dyssynchrony   Says he is doing pretty. Active around the house but doesn't exercise. Able to go up and down steps. No CP. Mild DOE. Compliant with meds   Father has CAD. No family hx of HF.   Lives with his father. His father drives him to appointments. Able to afford medications. Denies ETOH, tobacco, or drug use.   LHC 10/13/17: widely patent coronary arteries  RHC 10/13/17: RA mean 11 RV 36/5 LVEDP 19 AO 101/73 Cardiac Output (Fick) 4.81 Cardiac Index (Fick) 2.37  Echo 10/11/17: - Left ventricle: The cavity size was severely dilated. Wall   thickness was increased in a pattern of mild LVH. Systolic   function was severely reduced. The estimated ejection fraction   was in the range of 20% to 25%. Dyskinesis of the   basal-midanteroseptal myocardium. Doppler parameters are   consistent with a reversible restrictive pattern, indicative of   decreased left ventricular diastolic compliance and/or increased   left atrial pressure (grade 3 diastolic dysfunction). - Mitral valve: There was mild regurgitation. - Left atrium: The atrium was moderately dilated. - Pulmonary arteries: Systolic pressure was moderately to severely   increased. PA peak pressure: 64 mm Hg (S). - Pericardium, extracardiac: A trivial  pericardial effusion was   identified.  Review of Systems: [y] = yes, [ ]  = no      General: Weight gain [] ; Weight loss [ ] ; Anorexia [ ] ; Fatigue [] ; Fever [ ] ; Chills [ ] ; Weakness [ ]    Cardiac: Chest pain/pressure [ ] ; Resting SOB [ y]; Exertional SOB [y]; Orthopnea [ ] ; Pedal Edema [] ; Palpitations [ ] ; Syncope [ ] ; Presyncope [ ] ; Paroxysmal nocturnal dyspnea[ ]    Pulmonary: Cough [ ] ; Wheezing[ ] ; Hemoptysis[ ] ; Sputum [ ] ; Snoring [ ]    GI: Vomiting[ ] ; Dysphagia[ ] ; Melena[ ] ; Hematochezia [ ] ; Heartburn[ ] ; Abdominal pain [ ] ; Constipation [ ] ; Diarrhea [ ] ; BRBPR [ ]    GU: Hematuria[ ] ; Dysuria [ ] ; Nocturia[ ]  Vascular: Pain in legs with walking [ ] ; Pain in feet with lying flat [ ] ; Non-healing sores [ ] ; Stroke [ ] ; TIA [ ] ; Slurred speech [ ] ;   Neuro: Headaches[ ] ; Vertigo[ ] ; Seizures[ ] ; Paresthesias[ ] ;Blurred vision [ ] ; Diplopia [ ] ; Vision changes [ ]    Ortho/Skin: Arthritis [y]; Joint pain [y]; Muscle pain [ ] ; Joint swelling [ ] ; Back Pain [ ] ; Rash [ ]    Psych: Depression[ ] ; Anxiety[ ]    Heme: Bleeding problems [ ] ; Clotting disorders [ ] ; Anemia [ ]    Endocrine: Diabetes [ ] ; Thyroid dysfunction[ ]     Past Medical History:  Diagnosis Date   Chronic combined systolic and diastolic CHF (congestive heart failure) (HCC)    Hypertension  LBBB (left bundle branch block)    NICM (nonischemic cardiomyopathy) (HCC)    Pericardial effusion    a. trivial by echo 09/2017.    Current Outpatient Medications  Medication Sig Dispense Refill   aspirin 81 MG EC tablet Take 1 tablet (81 mg total) by mouth daily. Swallow whole. 90 tablet 3   carvedilol (COREG) 25 MG tablet Take 1 tablet (25 mg total) by mouth 2 (two) times daily. 180 tablet 3   dapagliflozin propanediol (FARXIGA) 10 MG TABS tablet Take 1 tablet (10 mg total) by mouth daily. 90 tablet 3   furosemide (LASIX) 40 MG tablet Take 1 tablet (40 mg total) by mouth daily. 90 tablet 3   isosorbide mononitrate (IMDUR)  30 MG 24 hr tablet Take 1 tablet (30 mg total) by mouth daily. 90 tablet 3   losartan (COZAAR) 50 MG tablet Take 2 tablets (100 mg total) by mouth daily. 180 tablet 0   nitroGLYCERIN (NITROSTAT) 0.4 MG SL tablet Place 1 tablet (0.4 mg total) under the tongue every 5 (five) minutes x 3 doses as needed for chest pain. 25 tablet 3   potassium chloride (KLOR-CON) 10 MEQ tablet Take 10 mEq by mouth daily.     spironolactone (ALDACTONE) 25 MG tablet Take 1 tablet by mouth once daily 90 tablet 3   Turmeric 500 MG CAPS Take 1-2 tablets by mouth once a week.     Vitamin D, Ergocalciferol, (DRISDOL) 1.25 MG (50000 UNIT) CAPS capsule Take 50,000 Units by mouth once a week.     No current facility-administered medications for this encounter.    Allergies  Allergen Reactions   Penicillins Hives    As child      Social History   Socioeconomic History   Marital status: Single    Spouse name: Not on file   Number of children: Not on file   Years of education: Not on file   Highest education level: Not on file  Occupational History   Occupation: Unemployed  Tobacco Use   Smoking status: Never   Smokeless tobacco: Never  Vaping Use   Vaping Use: Never used  Substance and Sexual Activity   Alcohol use: Never   Drug use: Never   Sexual activity: Not on file  Other Topics Concern   Not on file  Social History Narrative   Moved back to [place] to live with his parents   Social Determinants of Health   Financial Resource Strain: Not on file  Food Insecurity: Not on file  Transportation Needs: Not on file  Physical Activity: Not on file  Stress: Not on file  Social Connections: Not on file  Intimate Partner Violence: Not on file      Family History  Problem Relation Age of Onset   Cancer Mother    Coronary artery disease Father        had a stent approx 2015    Vitals:   01/01/22 1432  BP: 124/78  Pulse: 86  SpO2: 94%  Weight: 101.5 kg (223 lb 12.8 oz)   Wt Readings  from Last 3 Encounters:  01/01/22 101.5 kg (223 lb 12.8 oz)  11/19/21 99.8 kg (220 lb)  10/17/21 99.8 kg (220 lb)    PHYSICAL EXAM: General:  Well appearing. No resp difficulty HEENT: normal Neck: supple. no JVD. Carotids 2+ bilat; no bruits. No lymphadenopathy or thryomegaly appreciated. Cor: PMI nondisplaced. Regular rate & rhythm. No rubs, gallops or murmurs. Lungs: clear Abdomen: obese soft, nontender, nondistended. No  hepatosplenomegaly. No bruits or masses. Good bowel sounds. Extremities: no cyanosis, clubbing, rash, edema Neuro: alert & orientedx3, cranial nerves grossly intact. moves all 4 extremities w/o difficulty. Affect pleasant  ECG NSR LBBB 194 ms   ASSESSMENT & PLAN:  1. Chronic systolic HF (diagnosed 09/2017) due to NICM. EF 20-25% Unknown etiology. LHC 10/13/17: widely patent coronary arteries. He has a wide LBBB on his EKG. No ETOH use. No family hx of HF. No recent virus. Has HTN. Does not snore that he is aware of. TSH normal. - Doing well NYHA II - Volume status looks good.  - EF worsening despite 4-drug GDMT - Echo 1/22 EF 40-45% - Echo 1/23 EF 30-35% - Echo 3/23 EF 25-30%  - cMRI 2/22 EF 34% LBBB dyssynchrony  - almost certainly has LBBB CM with QRS nearly - Refer to EP for CRT - Labs today  2. HTN - Blood pressure well controlled. Continue current regimen.  3. LBBB - Almost certainly tthe cause of his NICM - As above. Refer EP   Arvilla Meres, MD 01/01/22

## 2022-01-01 NOTE — Patient Instructions (Addendum)
Medication Changes:  STOP Imdur (Isosorbide)  Lab Work:  Labs done today, we will call you for abnormal results  Testing/Procedures:  none  Referrals:  You have been referred to EP at [facility], they will call you to discuss  Special Instructions // Education:  Do the following things EVERYDAY: Weigh yourself in the morning before breakfast. Write it down and keep it in a log. Take your medicines as prescribed Eat low salt foods--Limit salt (sodium) to 2000 mg per day.  Stay as active as you can everyday Limit all fluids for the day to less than 2 liters   Follow-Up in: 6 months, **PLEASE CALL OUR OFFICE IN NOVEMBER TO SCHEDULE THIS APPOINTMENT  At the [facility], you and your health needs are our priority. We have a designated team specialized in the treatment of Heart Failure. This Care Team includes your primary Heart Failure Specialized Cardiologist (physician), Advanced Practice Providers (APPs- Physician Assistants and Nurse Practitioners), and Pharmacist who all work together to provide you with the care you need, when you need it.   You may see any of the following providers on your designated Care Team at your next follow up:  Dr Arvilla Meres Dr Carron Curie, NP Robbie Lis, [place] [facility] [place], [place] Karle Plumber, PharmD   Please be sure to bring in all your medications bottles to every appointment.   Need to Contact us:  If you have any questions or concerns before your next appointment please send [place] a message through [place] [employer] or call our office at [phone].    TO LEAVE A MESSAGE FOR THE NURSE SELECT OPTION 2, PLEASE LEAVE A MESSAGE INCLUDING: YOUR NAME DATE OF BIRTH CALL BACK NUMBER REASON FOR CALL**this is important as we prioritize the call backs  YOU WILL RECEIVE A CALL BACK THE SAME DAY AS LONG AS YOU CALL BEFORE 4:00 PM

## 2022-01-06 ENCOUNTER — Other Ambulatory Visit (HOSPITAL_BASED_OUTPATIENT_CLINIC_OR_DEPARTMENT_OTHER): Payer: Self-pay | Admitting: Cardiology

## 2022-01-06 DIAGNOSIS — I10 Essential (primary) hypertension: Secondary | ICD-10-CM

## 2022-04-06 ENCOUNTER — Encounter: Payer: Self-pay | Admitting: Medical

## 2022-04-06 ENCOUNTER — Ambulatory Visit: Payer: Medicaid Other | Attending: Medical | Admitting: Medical

## 2022-04-06 VITALS — BP 134/82 | HR 99 | Ht 66.0 in | Wt 224.0 lb

## 2022-04-06 DIAGNOSIS — I447 Left bundle-branch block, unspecified: Secondary | ICD-10-CM | POA: Diagnosis not present

## 2022-04-06 DIAGNOSIS — I5022 Chronic systolic (congestive) heart failure: Secondary | ICD-10-CM | POA: Diagnosis not present

## 2022-04-06 DIAGNOSIS — I420 Dilated cardiomyopathy: Secondary | ICD-10-CM

## 2022-04-06 DIAGNOSIS — I10 Essential (primary) hypertension: Secondary | ICD-10-CM

## 2022-04-06 NOTE — Patient Instructions (Addendum)
Medication Instructions:  Your physician recommends that you continue on your current medications as directed. Please refer to the Current Medication list given to you today.   Labwork: None  Testing/Procedures: None  Follow-Up: Follow up with MD/APP in 6 months.   Any Other Special Instructions Will Be Listed Below (If Applicable).  You have been referred to EP. They will contact you with your first appointment.    If you need a refill on your cardiac medications before your next appointment, please call your pharmacy.

## 2022-04-06 NOTE — Progress Notes (Signed)
Cardiology Office Note:    Date:  04/06/2022   ID:  John Olsen, DOB 28-Sep-1975, MRN [id number]  PCP:  Barbarann Ehlers  [facility] Cardiologist:  Armanda Magic, MD  [facility] Electrophysiologist:  None   Referring MD: Dolores Patty, MD   Chief Complaint: F/u heart failure  History of Present Illness:    John Olsen is a 46 y.o. male with a hx of HTN, obesity, LBBB, systolic HF, LBBB, mild to moderate OSA who presents for follow-up.   Admitted 09/2017 with heart failure. Echo showed LVEF 20-25%. LHC showed normal coronaries. He was diuresed with IV lasix and transitioned to lasix 40mg  daily. DC weight was 206lbs  Echo from 06/2020 showed LVEF 40-45%. Echo from 06/2021 showed LVEF 30-35%.   He was hospitalized 06/2021 for progressive SOB and was found to be in acute heart failure. Echo from showed worsening LVEF 25-305%. LHC showed widely patent coronary arteries and normal LVEDP. He was intolerant to [facility] cMRI 07/2020 showed LVEF 35% and LBBB dyssynchrony. He is on Coreg, Spironolactone, Imdur, Farxiga and Losartan.  He was referred to the Advanced heart failure team, seen 01/01/22. EF was low despite being on GDMT. He was referred to EP to discuss CRT.   Today, the patient reports he is doing well. He denies chest pain or shortness of breath. He denies lower leg edema, orthopea or pnd. BP is good. He is under a little stress with no income. He ran out of social security and is in the process of reapplying. He initially was given 01/03/22 for mental health conditions, for which I encouraged him to see PCP for this. I will give a letter stating his heart conditions, saying they are permanent.  Past Medical History:  Diagnosis Date   Chronic combined systolic and diastolic CHF (congestive heart failure) (HCC)    Hypertension    LBBB (left bundle branch block)    NICM (nonischemic cardiomyopathy) (HCC)    Pericardial effusion    a. trivial by echo 09/2017.     Past Surgical History:  Procedure Laterality Date   LEFT HEART CATH AND CORONARY ANGIOGRAPHY N/A 07/03/2021   Procedure: LEFT HEART CATH AND CORONARY ANGIOGRAPHY;  Surgeon: 08/31/2021, MD;  [facility] IN[facility] CV LAB;  Service: Cardiovascular;  Laterality: N/A;   NEUROBLASTOMA EXCISION     Had prior to 46 year of age, abdominal   RIGHT/LEFT HEART CATH AND CORONARY ANGIOGRAPHY N/A 10/13/2017   Procedure: RIGHT/LEFT HEART CATH AND CORONARY ANGIOGRAPHY;  Surgeon: 10/15/2017, MD;  Location: [facility] INVASIVE CV LAB;  Service: Cardiovascular;  Laterality: N/A;   ULTRASOUND GUIDANCE FOR VASCULAR ACCESS  10/13/2017   Procedure: Ultrasound Guidance For Vascular Access;  Surgeon: 10/15/2017, MD;  Location: [facility] INVASIVE CV LAB;  Service: Cardiovascular;;    Current Medications: Current Meds  Medication Sig   aspirin 81 MG EC tablet Take 1 tablet (81 mg total) by mouth daily. Swallow whole.   carvedilol (COREG) 25 MG tablet Take 1 tablet (25 mg total) by mouth 2 (two) times daily.   dapagliflozin propanediol (FARXIGA) 10 MG TABS tablet Take 1 tablet (10 mg total) by mouth daily.   furosemide (LASIX) 40 MG tablet Take 1 tablet by mouth once daily   losartan (COZAAR) 50 MG tablet Take 2 tablets by mouth once daily   nitroGLYCERIN (NITROSTAT) 0.4 MG SL tablet Place 1 tablet (0.4 mg total) under the tongue every 5 (five) minutes x 3 doses as  needed for chest pain.   potassium chloride (KLOR-CON) 10 MEQ tablet Take 1 tablet by mouth once daily   spironolactone (ALDACTONE) 25 MG tablet Take 1 tablet by mouth once daily   Turmeric 500 MG CAPS Take 1-2 tablets by mouth once a week.   Vitamin D, Ergocalciferol, (DRISDOL) 1.25 MG (50000 UNIT) CAPS capsule Take 50,000 Units by mouth once a week.     Allergies:   Penicillins   Social History   Socioeconomic History   Marital status: Single    Spouse name: Not on file   Number of children: Not on file   Years of education: Not on file   Highest  education level: Not on file  Occupational History   Occupation: Unemployed  Tobacco Use   Smoking status: Never   Smokeless tobacco: Never  Vaping Use   Vaping Use: Never used  Substance and Sexual Activity   Alcohol use: Never   Drug use: Never   Sexual activity: Not on file  Other Topics Concern   Not on file  Social History Narrative   Moved back to [place] to live with his parents   Social Determinants of Health   Financial Resource Strain: Not on file  Food Insecurity: Not on file  Transportation Needs: Not on file  Physical Activity: Not on file  Stress: Not on file  Social Connections: Not on file     Family History: The patient's family history includes Cancer in his mother; Coronary artery disease in his father.  ROS:   Please see the history of present illness.     All other systems reviewed and are negative.  EKGs/Labs/Other Studies Reviewed:    The following studies were reviewed today:  Echo 08/2021  1. Left ventricular ejection fraction, by estimation, is 25 to 30%. The  left ventricle has severely decreased function. The left ventricle  demonstrates global hypokinesis and regional wall motion abnormalities  (see scoring diagram/findings for  description). The left ventricular internal cavity size was severely  dilated. There is mild concentric left ventricular hypertrophy. Left  ventricular diastolic function could not be evaluated. There is akinesis  of the left ventricular, apical segment.   2. Right ventricular systolic function is normal. The right ventricular  size is normal.   3. The mitral valve is normal in structure. Trivial mitral valve  regurgitation. No evidence of mitral stenosis.   4. The aortic valve is normal in structure. Aortic valve regurgitation is  not visualized. No aortic stenosis is present.   5. The inferior vena cava is normal in size with greater than 50%  respiratory variability, suggesting right atrial pressure of 3  mmHg.   Comparison(s): 07/02/21 EF 30-35%.   Cardiac cath 06/2021 CONCLUSIONS: Widely patent coronary arteries.  No change from 2019. Normal LVEDP. RECOMMENDATIONS: Elevated troponin secondary to demand ischemia in the setting of severe systolic dysfunction and high filling pressures that have now been appropriately treated.   Echo 06/2021  1. Left ventricular ejection fraction, by estimation, is 30 to 35%. The  left ventricle has moderately decreased function. The left ventricle  demonstrates regional wall motion abnormalities (see scoring  diagram/findings for description). The left  ventricular internal cavity size was severely dilated. There is mild  concentric left ventricular hypertrophy. Left ventricular diastolic  parameters are consistent with Grade II diastolic dysfunction  (pseudonormalization). Elevated left ventricular  end-diastolic pressure. There is akinesis of the left ventricular, apical  anterior wall. There is akinesis of the left ventricular, mid  anteroseptal  wall. There is akinesis of the left ventricular, entire inferior wall.  There is akinesis of the left  ventricular, basal-mid inferoseptal wall.   2. Right ventricular systolic function is normal. The right ventricular  size is normal. There is normal pulmonary artery systolic pressure. The  estimated right ventricular systolic pressure is 24.2 mmHg.   3. Left atrial size was mildly dilated.   4. Therer is a trivial pericardial effusion is anterior to the right  ventricle.   5. The mitral valve is abnormal. Mild mitral valve regurgitation. No  evidence of mitral stenosis.   6. The aortic valve is normal in structure. Aortic valve regurgitation is  not visualized. No aortic stenosis is present.   7. The inferior vena cava is dilated in size with >50% respiratory  variability, suggesting right atrial pressure of 8 mmHg.   8. Compared to prior study, LVF has declined and there are new wall  motion  abnormalities.    Echo 06/2020  1. Left ventricular ejection fraction, by estimation, is 40 to 45%. The  left ventricle has mildly decreased function. The left ventricle  demonstrates global hypokinesis. There is severe concentric left  ventricular hypertrophy. Left ventricular diastolic   parameters are indeterminate.   2. Right ventricular systolic function is normal. The right ventricular  size is normal.   3. Left atrial size was mildly dilated.   4. The mitral valve is normal in structure. Trivial mitral valve  regurgitation.   5. The aortic valve is grossly normal. Aortic valve regurgitation is  trivial.   6. The inferior vena cava is normal in size with greater than 50%  respiratory variability, suggesting right atrial pressure of 3 mmHg.    EKG:  EKG is not ordered today.   Recent Labs: 07/01/2021: ALT 91; TSH 3.589 07/03/2021: Magnesium 2.2 01/01/2022: B Natriuretic Peptide 199.4; BUN 17; Creatinine, Ser 1.12; Hemoglobin 16.6; Platelets 243; Potassium 3.8; Sodium 136  Recent Lipid Panel    Component Value Date/Time   CHOL 102 07/02/2021 0328   TRIG 85 07/02/2021 0328   HDL 32 (L) 07/02/2021 0328   CHOLHDL 3.2 07/02/2021 0328   VLDL 17 07/02/2021 0328   LDLCALC 53 07/02/2021 0328     Physical Exam:    VS:  BP 134/82   Pulse 99   Ht 5\' 6"  (1.676 m)   Wt 224 lb (101.6 kg)   SpO2 95%   BMI 36.15 kg/m     Wt Readings from Last 3 Encounters:  04/06/22 224 lb (101.6 kg)  01/01/22 223 lb 12.8 oz (101.5 kg)  11/19/21 220 lb (99.8 kg)     GEN:  Well nourished, well developed in no acute distress HEENT: Normal NECK: No JVD; No carotid bruits LYMPHATICS: No lymphadenopathy CARDIAC: RRR, no murmurs, rubs, gallops RESPIRATORY:  Clear to auscultation without rales, wheezing or rhonchi  ABDOMEN: Soft, non-tender, non-distended MUSCULOSKELETAL:  No edema; No deformity  SKIN: Warm and dry NEUROLOGIC:  Alert and oriented x 3 PSYCHIATRIC:  Normal affect   ASSESSMENT:     1. DCM (dilated cardiomyopathy) (HCC)   2. Chronic systolic heart failure (HCC)   3. Essential hypertension   4. LBBB (left bundle branch block)    PLAN:    In order of problems listed above:  Chronic systolic heart failure NICM Patient has suspected NICM from LBBB. EF as high as 40-45% in the past. He had worsening EF by echo 06/2021 with heart cath showing normal coronaries despite being on  4 drug GDMT. cMRI showed EF 34% with LBBB dyssynchrony. He is euvolemic on exam today. Long discussion regarding CRT device; he is willing to see EP to further discuss this so we will set him up. Continue Coreg 25mg  BID, Losartan 50mg  BID, Farxiga 10mg  daily, spironolactone 25mg  daily, lasix 40mg  daily and potassium.   HTN BP is good today, continue current medications  LBBB Plan to see EP as above to discuss CRT device.   Disposition: Follow up in 6 month(s) with MD/APP     Signed, Briea Mcenery Ninfa Meeker, PA-C  04/06/2022 4:23 PM    [facility]

## 2022-04-08 ENCOUNTER — Telehealth: Payer: Self-pay | Admitting: Cardiology

## 2022-04-08 NOTE — Telephone Encounter (Signed)
Father called stating DSS would like a letter from the doctor outlining the duration of the patient's condition.  Father stated the letter should be addressed to [place], Attention:  E. Hairston.  Father would like to collect this letter when it is ready.

## 2022-04-08 NOTE — Telephone Encounter (Signed)
Spoke to father of pt, okay per DPR, who was upset that provider would not state that patient needed to be taken out of work permanently d/t pt's conditions. Father stated that DSS is cutting off pt's SSI disability November 1 and all of his medical expenses will have to come out of pocket. Voiced to father that I can send a phone note to provider that voices his concerns and see what can be done. Father wanted letter written today. Expressed to pts father, that I as a CMA cannot write letter without approval from provider. Father hung up and ended call.  Originally letter in pt chart.

## 2022-04-09 NOTE — Telephone Encounter (Signed)
Follow Up:    Patient called and wanted  to be sure to add to he letter that he is disabled and unable to work. Please -e-mail this letter to [email or web address]. He said the phone number is [phone].

## 2022-04-09 NOTE — Telephone Encounter (Signed)
Letter faxed to Ayesha Mohair at [employer] ,fax [phone]

## 2022-05-08 ENCOUNTER — Ambulatory Visit: Payer: Medicaid Other | Attending: Internal Medicine | Admitting: Internal Medicine

## 2022-05-08 VITALS — BP 112/80 | HR 89 | Ht 66.0 in | Wt 224.8 lb

## 2022-05-08 DIAGNOSIS — I447 Left bundle-branch block, unspecified: Secondary | ICD-10-CM

## 2022-05-08 DIAGNOSIS — I5041 Acute combined systolic (congestive) and diastolic (congestive) heart failure: Secondary | ICD-10-CM

## 2022-05-08 DIAGNOSIS — I10 Essential (primary) hypertension: Secondary | ICD-10-CM | POA: Diagnosis not present

## 2022-05-08 NOTE — Patient Instructions (Addendum)
Medication Instructions:  Your physician recommends that you continue on your current medications as directed. Please refer to the Current Medication list given to you today.  *If you need a refill on your cardiac medications before your next appointment, please call your pharmacy*  Lab Work: None ordered.  If you have labs (blood work) drawn today and your tests are completely normal, you will receive your results only by: MyChart Message (if you have MyChart) OR A paper copy in the mail If you have any lab test that is abnormal or we need to change your treatment, we will call you to review the results.  Testing/Procedures: None ordered.  Follow-Up:  Dates that Dr. Ladona Ridgel can implant a [place] BiV ICD, they are as follows:  December:  18  January:  3, 4, 8, 15, 16, 22, 29, 31  Please contact us with the Month and Day you choose.   Please see ICD Booklet / Given to patient.

## 2022-05-08 NOTE — Progress Notes (Signed)
HPI John Olsen is referred by Dr. Dorthea Cove for evaluation of a non-ischemic CM. He has a h/o chronic systolic heart failure and LBBB and an EF of 25% by most recent echo. The patient has been on GDMT. He has not had syncope. He has a very wide left bundle.  Allergies  Allergen Reactions   Penicillins Hives    As child     Current Outpatient Medications  Medication Sig Dispense Refill   aspirin 81 MG EC tablet Take 1 tablet (81 mg total) by mouth daily. Swallow whole. 90 tablet 3   carvedilol (COREG) 25 MG tablet Take 1 tablet (25 mg total) by mouth 2 (two) times daily. 180 tablet 3   dapagliflozin propanediol (FARXIGA) 10 MG TABS tablet Take 1 tablet (10 mg total) by mouth daily. 90 tablet 3   furosemide (LASIX) 40 MG tablet Take 1 tablet by mouth once daily 90 tablet 2   losartan (COZAAR) 50 MG tablet Take 2 tablets by mouth once daily 180 tablet 2   nitroGLYCERIN (NITROSTAT) 0.4 MG SL tablet Place 1 tablet (0.4 mg total) under the tongue every 5 (five) minutes x 3 doses as needed for chest pain. 25 tablet 3   potassium chloride (KLOR-CON) 10 MEQ tablet Take 1 tablet by mouth once daily 90 tablet 2   spironolactone (ALDACTONE) 25 MG tablet Take 1 tablet by mouth once daily 30 tablet 9   Turmeric 500 MG CAPS Take 1-2 tablets by mouth once a week.     Vitamin D, Ergocalciferol, (DRISDOL) 1.25 MG (50000 UNIT) CAPS capsule Take 50,000 Units by mouth once a week.     No current facility-administered medications for this visit.     Past Medical History:  Diagnosis Date   Chronic combined systolic and diastolic CHF (congestive heart failure) (HCC)    Hypertension    LBBB (left bundle branch block)    NICM (nonischemic cardiomyopathy) (HCC)    Pericardial effusion    a. trivial by echo 09/2017.    ROS:   All systems reviewed and negative except as noted in the HPI.   Past Surgical History:  Procedure Laterality Date   LEFT HEART CATH AND CORONARY ANGIOGRAPHY N/A 07/03/2021    Procedure: LEFT HEART CATH AND CORONARY ANGIOGRAPHY;  Surgeon: Lyn Records, MD;  Location: [facility] INVASIVE CV LAB;  Service: Cardiovascular;  Laterality: N/A;   NEUROBLASTOMA EXCISION     Had prior to 46 year of age, abdominal   RIGHT/LEFT HEART CATH AND CORONARY ANGIOGRAPHY N/A 10/13/2017   Procedure: RIGHT/LEFT HEART CATH AND CORONARY ANGIOGRAPHY;  Surgeon: Lyn Records, MD;  Location: [facility]VASIVE CV LAB;  Service: Cardiovascular;  Laterality: N/A;   ULTRASOUND GUIDANCE FOR VASCULAR ACCESS  10/13/2017   Procedure: Ultrasound Guidance For Vascular Access;  Surgeon: Lyn Records, MD;  Location: [facility] INVASIVE CV LAB;  Service: Cardiovascular;;     Family History  Problem Relation Age of Onset   Cancer Mother    Coronary artery disease Father        had a stent approx 2015     Social History   Socioeconomic History   Marital status: Single    Spouse name: Not on file   Number of children: Not on file   Years of education: Not on file   Highest education level: Not on file  Occupational History   Occupation: Unemployed  Tobacco Use   Smoking status: Never   Smokeless tobacco: Never  Vaping  Use   Vaping Use: Never used  Substance and Sexual Activity   Alcohol use: Never   Drug use: Never   Sexual activity: Not on file  Other Topics Concern   Not on file  Social History Narrative   Moved back to [place] to live with his parents   Social Determinants of Health   Financial Resource Strain: Not on file  Food Insecurity: Not on file  Transportation Needs: Not on file  Physical Activity: Not on file  Stress: Not on file  Social Connections: Not on file  Intimate Partner Violence: Not on file     BP 112/80   Pulse 89   Ht 5\' 6"  (1.676 m)   Wt 224 lb 12.8 oz (102 kg)   SpO2 95%   BMI 36.28 kg/m   Physical Exam:  Well appearing NAD HEENT: Unremarkable Neck:  No JVD, no thyromegally Lymphatics:  No adenopathy Back:  No CVA tenderness Lungs:  Clear HEART:   Regular rate rhythm, no murmurs, no rubs, no clicks Abd:  soft, positive bowel sounds, no organomegally, no rebound, no guarding Ext:  2 plus pulses, no edema, no cyanosis, no clubbing Skin:  No rashes no nodules Neuro:  CN II through XII intact, motor grossly intact  EKG - nsr with LBBB  Assess/Plan: Chronic systolic heart failure - his symptoms are class 2B and his EF is 25%. I have discussed the indications/risks/benefits/goals/expectations of biv ICD insertion and he wishes to proceed.  Obesity - Once his device is in place, he will need to work on weight loss.   Amin Fornwalt,MD

## 2022-05-11 IMAGING — CR DG CHEST 2V
2 series · 2 of 2 positions shown · non-contrast
Comparison: Chest radiograph 02/24/2020

CLINICAL DATA: Shortness of breath

EXAM:
CHEST - 2 VIEW

[w chest pa]
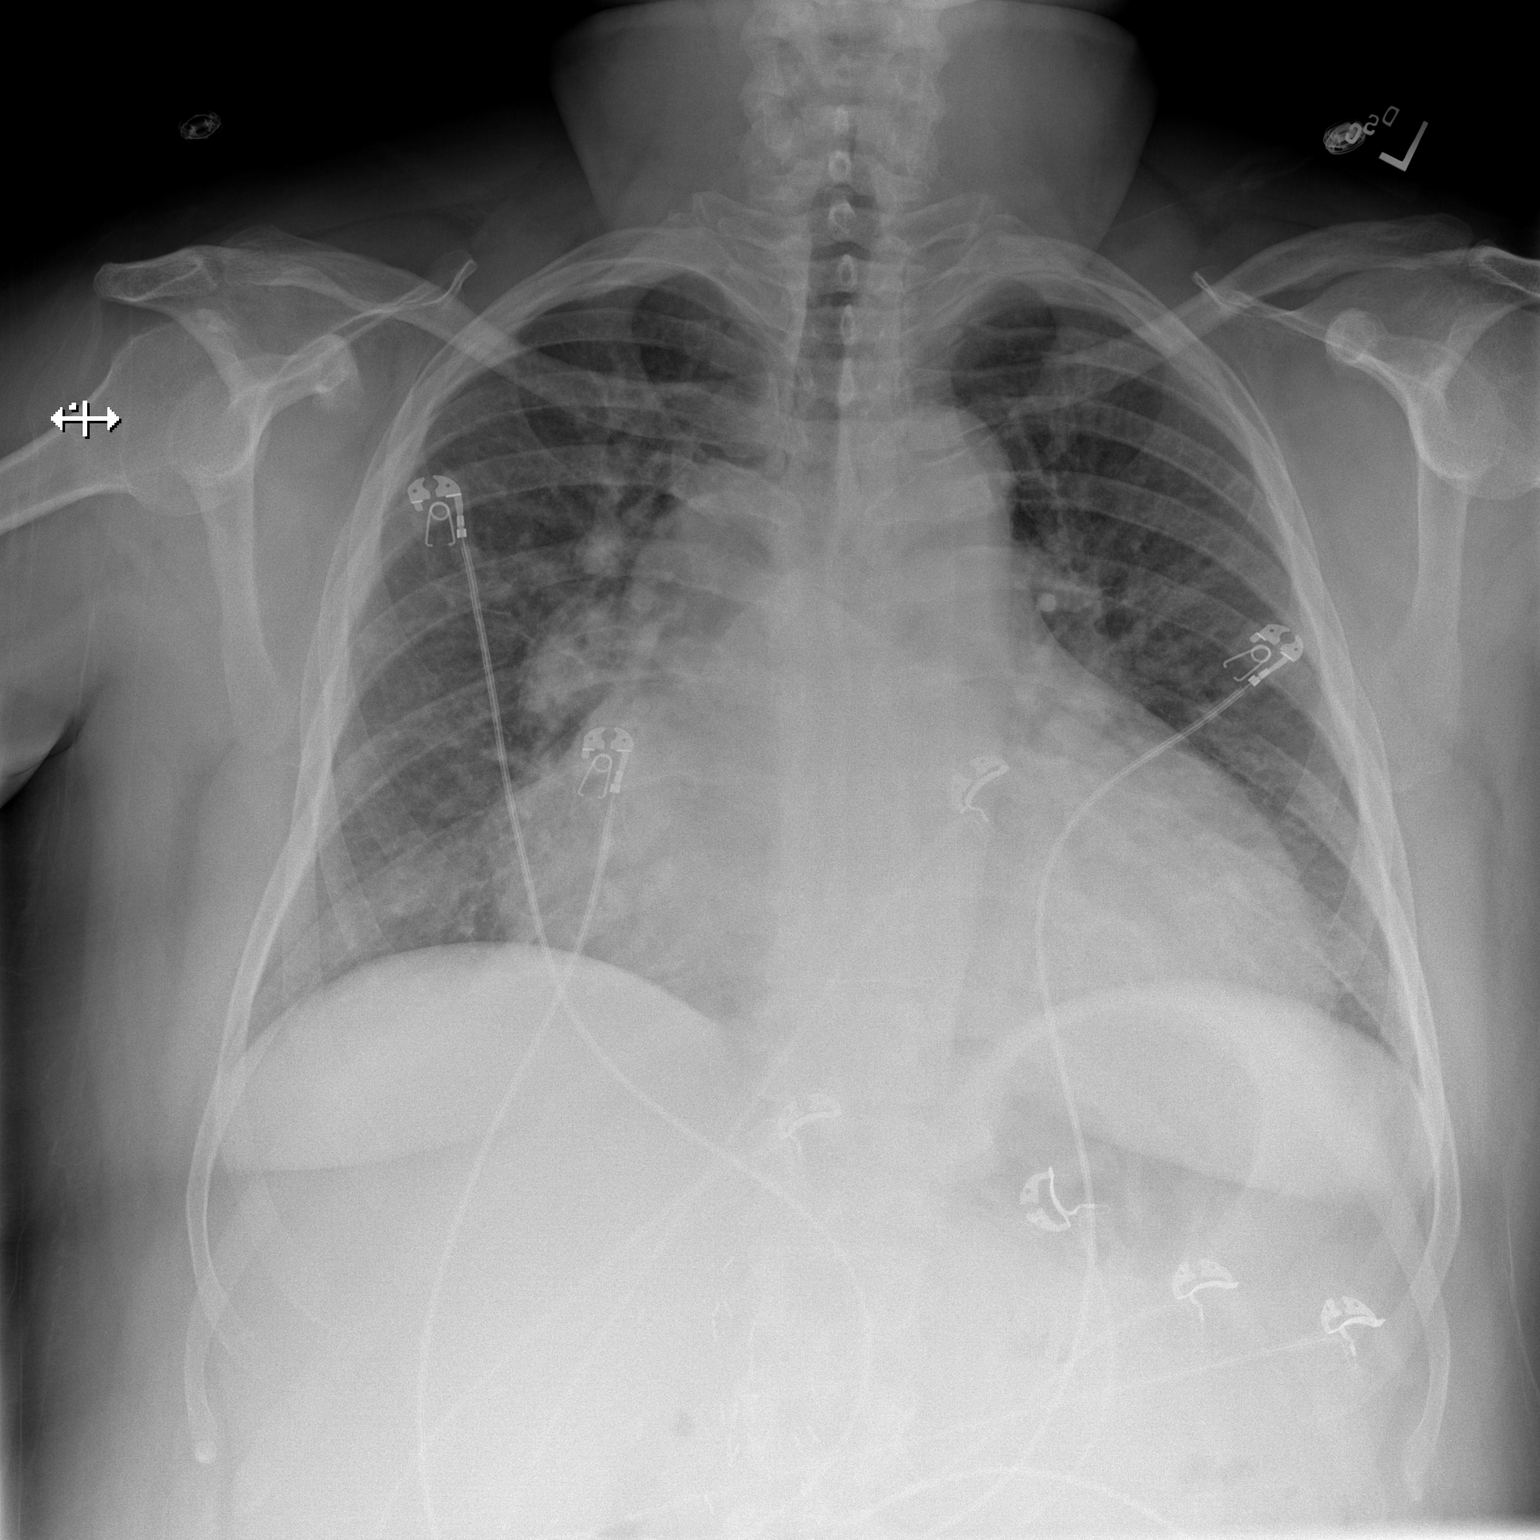

[w chest lat]
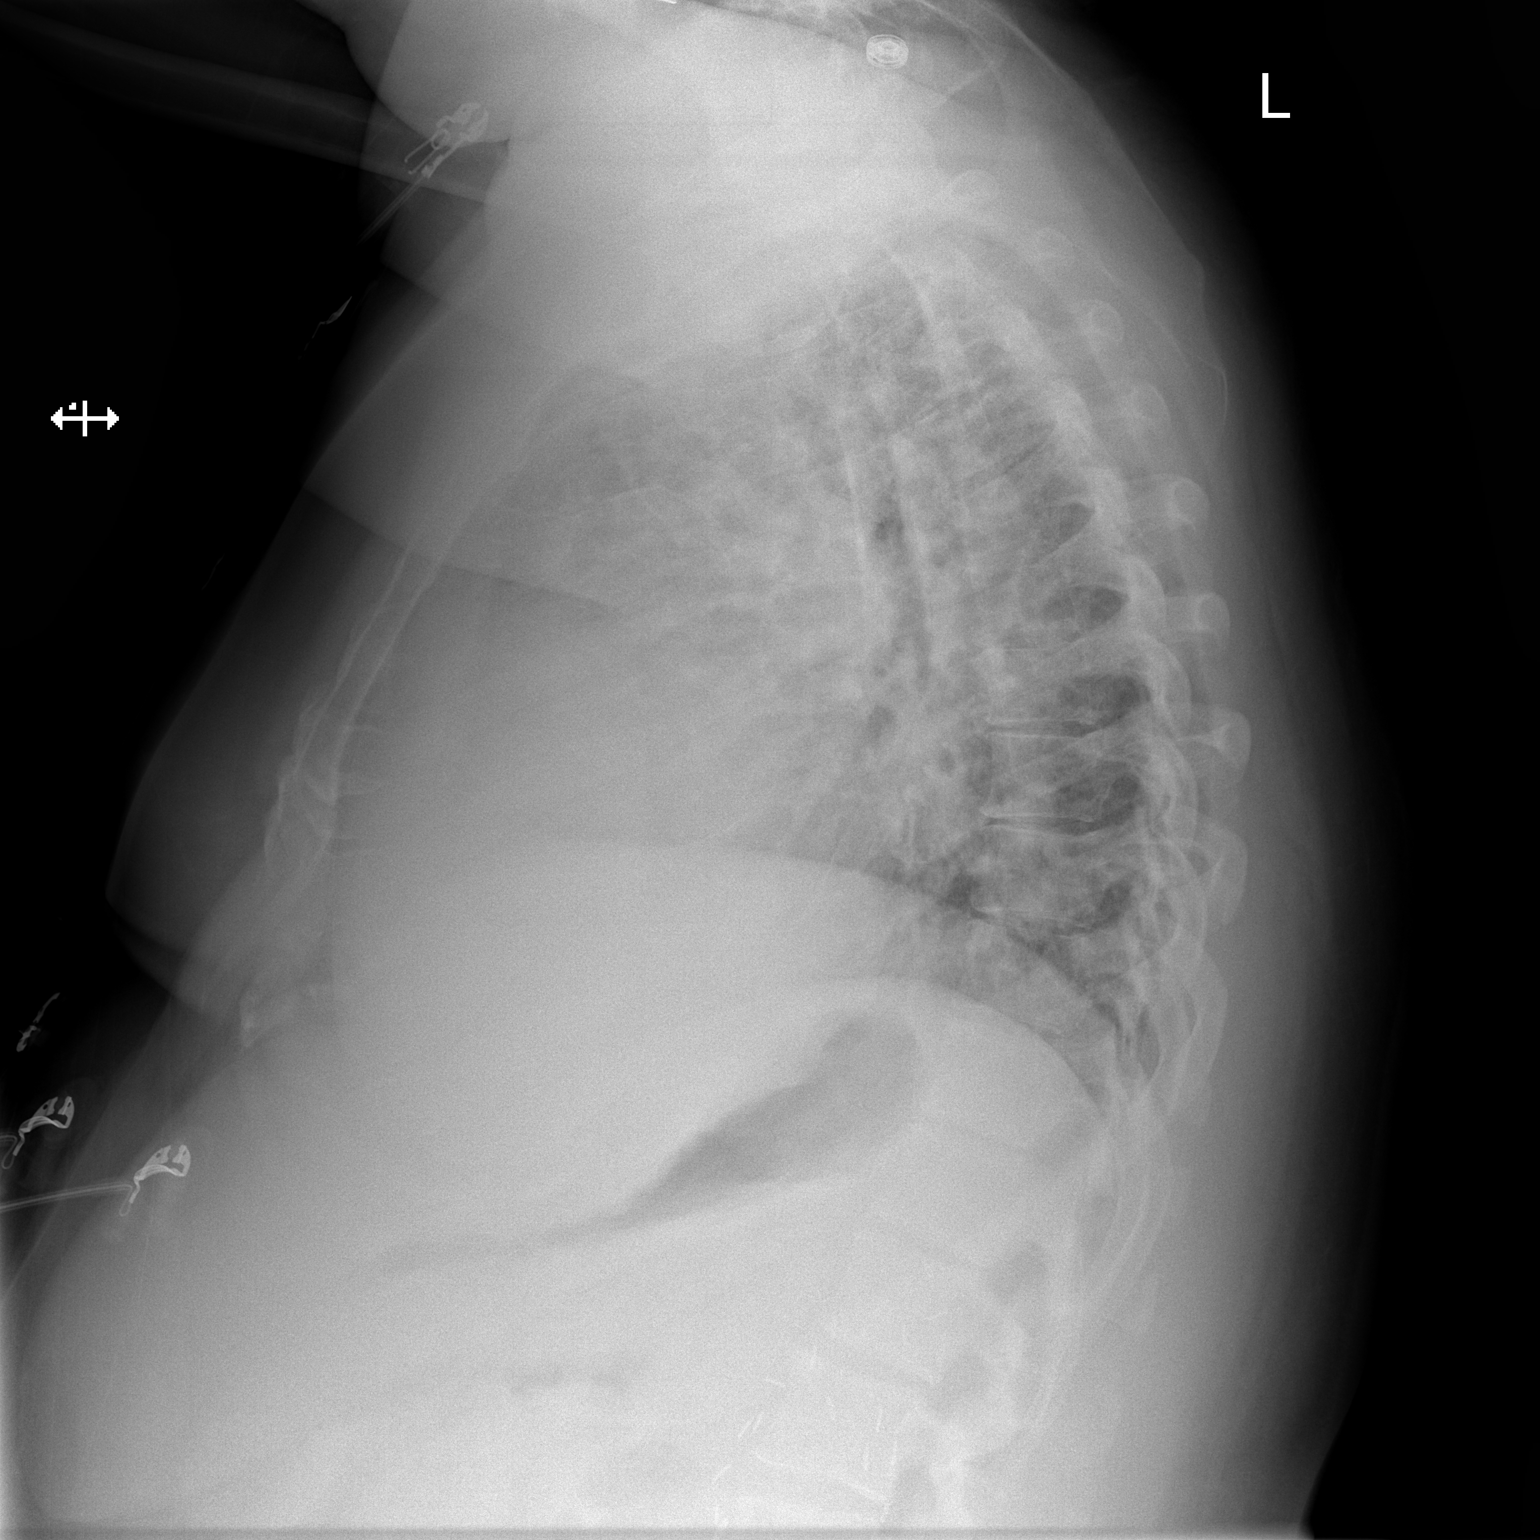

[2 of 2 positions shown; findings below may reference images not displayed]

FINDINGS: Heart is markedly enlarged, unchanged. The mediastinal contours are
stable. Enlargement of the pulmonary vasculature particularly on the
right suggests pulmonary hypertension.

There is central vascular congestion without evidence of overt
pulmonary edema. There is no focal consolidation. There is no
pleural effusion or pneumothorax.

There is no acute osseous abnormality. Abdominal surgical clips are
noted.
IMPRESSION: Cardiomegaly with vascular congestion but no definite overt
pulmonary edema.

## 2022-05-13 ENCOUNTER — Telehealth: Payer: Self-pay | Admitting: Internal Medicine

## 2022-05-13 NOTE — Telephone Encounter (Signed)
Left message for patient advising on EKG from 11/17. Advised to call back with any further questions.

## 2022-05-13 NOTE — Telephone Encounter (Signed)
Pt is calling for EKG results from 05/08/22

## 2022-05-20 ENCOUNTER — Telehealth: Payer: Self-pay | Admitting: Cardiology

## 2022-05-20 DIAGNOSIS — I5022 Chronic systolic (congestive) heart failure: Secondary | ICD-10-CM

## 2022-05-20 NOTE — Telephone Encounter (Signed)
Pt aware we will place referral to [facility] per his request. Pt appreciates our help with this.

## 2022-05-20 NOTE — Telephone Encounter (Signed)
Patient would like a referral sent to [facility] for a second opinion.  Fax# [phone], Attn: [facility].  Patient would like the referral sent before 12/1 due to insurance changes and would like a call back or text to confirm.

## 2022-06-02 ENCOUNTER — Ambulatory Visit: Payer: Medicaid Other | Admitting: Internal Medicine

## 2022-06-10 ENCOUNTER — Other Ambulatory Visit: Payer: Self-pay | Admitting: Cardiology

## 2022-06-19 ENCOUNTER — Telehealth: Payer: Self-pay

## 2022-06-19 ENCOUNTER — Telehealth: Payer: Self-pay | Admitting: Cardiology

## 2022-06-19 MED ORDER — CARVEDILOL 25 MG PO TABS
25.0000 mg | ORAL_TABLET | Freq: Two times a day (BID) | ORAL | 1 refills | Status: AC
Start: 2022-06-19 — End: 2022-12-16

## 2022-06-19 NOTE — Telephone Encounter (Signed)
*  STAT* If patient is at the pharmacy, call can be transferred to refill team.   1. Which medications need to be refilled? (please list name of each medication and dose if known) carvedilol (COREG) 25 MG tablet    2. Which pharmacy/location (including street and city if local pharmacy) is medication to be sent to?  [facility] [phone] - [place], [place] - [address] AT [facility] [address]   3. Do they need a 30 day or 90 day supply? 90

## 2022-06-19 NOTE — Telephone Encounter (Signed)
RX has been sent.

## 2022-06-19 NOTE — Telephone Encounter (Signed)
LM for pt to call back to schedule procedure. 

## 2022-07-08 NOTE — Telephone Encounter (Signed)
2nd attempt to call pt to schedule procedure. LM for pt to call back.

## 2022-07-20 NOTE — Telephone Encounter (Signed)
3rd attempt to call pt to schedule procedure. LM for pt to call back.

## 2022-07-30 ENCOUNTER — Other Ambulatory Visit: Payer: Self-pay

## 2022-08-21 ENCOUNTER — Other Ambulatory Visit: Payer: Self-pay | Admitting: *Deleted

## 2022-08-21 MED ORDER — DAPAGLIFLOZIN PROPANEDIOL 10 MG PO TABS: 10.0000 mg | ORAL_TABLET | Freq: Every day | ORAL | 3 refills | Status: AC

## 2022-10-07 ENCOUNTER — Ambulatory Visit: Payer: Medicaid Other | Admitting: Student

## 2022-11-05 ENCOUNTER — Other Ambulatory Visit: Payer: Self-pay

## 2022-11-05 MED ORDER — ASPIRIN 81 MG PO TBEC: 81.0000 mg | DELAYED_RELEASE_TABLET | Freq: Every day | ORAL | 3 refills | Status: AC

## 2022-11-20 ENCOUNTER — Telehealth: Payer: Self-pay | Admitting: Cardiology

## 2022-11-20 NOTE — Telephone Encounter (Signed)
Spoke to pharmacy who verbalized understanding.

## 2022-11-20 NOTE — Telephone Encounter (Signed)
Spoke to patient who stated that he is being followed by [facility]. Patient asked for medications be sent to [facility] for refills.  Will call [facility] and notify pharmacy.

## 2022-11-20 NOTE — Telephone Encounter (Signed)
*  STAT* If patient is at the pharmacy, call can be transferred to refill team.   1. Which medications need to be refilled? (please list name of each medication and dose if known)  furosemide (LASIX) 40 MG tablet   nitroGLYCERIN (NITROSTAT) 0.4 MG SL tablet  potassium chloride (KLOR-CON) 10 MEQ tablet   2. Which pharmacy/location (including street and city if local pharmacy) is medication to be sent to?  [facility] - [place], [place] - [address]   3. Do they need a 30 day or 90 day supply? 90

## 2023-02-16 ENCOUNTER — Other Ambulatory Visit: Payer: Self-pay | Admitting: Cardiology
